# Patient Record
Sex: Female | Born: 1960 | Race: White | Hispanic: No | Marital: Married | Smoking: Former smoker
Health system: Southern US, Community
[De-identification: ages and names within clinical notes are randomized; demographics above are authoritative.]

## PROBLEM LIST (undated history)

## (undated) DIAGNOSIS — I358 Other nonrheumatic aortic valve disorders: Secondary | ICD-10-CM

## (undated) DIAGNOSIS — F5101 Primary insomnia: Secondary | ICD-10-CM

## (undated) DIAGNOSIS — R0683 Snoring: Secondary | ICD-10-CM

## (undated) DIAGNOSIS — E782 Mixed hyperlipidemia: Secondary | ICD-10-CM

## (undated) DIAGNOSIS — G2581 Restless legs syndrome: Secondary | ICD-10-CM

## (undated) DIAGNOSIS — E279 Disorder of adrenal gland, unspecified: Secondary | ICD-10-CM

## (undated) DIAGNOSIS — F419 Anxiety disorder, unspecified: Secondary | ICD-10-CM

## (undated) DIAGNOSIS — E278 Other specified disorders of adrenal gland: Secondary | ICD-10-CM

## (undated) DIAGNOSIS — F411 Generalized anxiety disorder: Secondary | ICD-10-CM

## (undated) DIAGNOSIS — I200 Unstable angina: Secondary | ICD-10-CM

## (undated) DIAGNOSIS — F330 Major depressive disorder, recurrent, mild: Secondary | ICD-10-CM

## (undated) HISTORY — DX: Anxiety disorder, unspecified: F41.9

## (undated) HISTORY — DX: Disorder of adrenal gland, unspecified: E27.9

## (undated) HISTORY — DX: Restless legs syndrome: G25.81

## (undated) HISTORY — DX: Unstable angina: I20.0

## (undated) HISTORY — DX: Primary insomnia: F51.01

## (undated) HISTORY — DX: Major depressive disorder, recurrent, mild: F33.0

## (undated) HISTORY — DX: Generalized anxiety disorder: F41.1

## (undated) HISTORY — DX: Other specified disorders of adrenal gland: E27.8

## (undated) HISTORY — DX: Mixed hyperlipidemia: E78.2

## (undated) HISTORY — PX: CHOLECYSTECTOMY: SHX55

## (undated) HISTORY — DX: Other nonrheumatic aortic valve disorders: I35.8

## (undated) HISTORY — DX: Snoring: R06.83

## (undated) HISTORY — PX: ABDOMINAL HYSTERECTOMY: SHX81

---

## 2002-07-31 ENCOUNTER — Other Ambulatory Visit: Admission: RE | Admit: 2002-07-31 | Discharge: 2002-07-31 | Payer: Self-pay | Admitting: Obstetrics and Gynecology

## 2015-10-14 DIAGNOSIS — R5383 Other fatigue: Secondary | ICD-10-CM | POA: Insufficient documentation

## 2015-10-14 DIAGNOSIS — N390 Urinary tract infection, site not specified: Secondary | ICD-10-CM

## 2015-10-14 DIAGNOSIS — R1032 Left lower quadrant pain: Secondary | ICD-10-CM | POA: Insufficient documentation

## 2015-10-14 DIAGNOSIS — I10 Essential (primary) hypertension: Secondary | ICD-10-CM | POA: Insufficient documentation

## 2015-10-14 HISTORY — DX: Left lower quadrant pain: R10.32

## 2015-10-14 HISTORY — DX: Urinary tract infection, site not specified: N39.0

## 2015-10-14 HISTORY — DX: Essential (primary) hypertension: I10

## 2015-10-14 HISTORY — DX: Other fatigue: R53.83

## 2015-11-29 DIAGNOSIS — R0609 Other forms of dyspnea: Secondary | ICD-10-CM | POA: Insufficient documentation

## 2015-11-29 DIAGNOSIS — R0789 Other chest pain: Secondary | ICD-10-CM

## 2015-11-29 DIAGNOSIS — J431 Panlobular emphysema: Secondary | ICD-10-CM | POA: Insufficient documentation

## 2015-11-29 DIAGNOSIS — Z8249 Family history of ischemic heart disease and other diseases of the circulatory system: Secondary | ICD-10-CM | POA: Insufficient documentation

## 2015-11-29 HISTORY — DX: Other forms of dyspnea: R06.09

## 2015-11-29 HISTORY — DX: Family history of ischemic heart disease and other diseases of the circulatory system: Z82.49

## 2015-11-29 HISTORY — DX: Panlobular emphysema: J43.1

## 2015-11-29 HISTORY — DX: Other chest pain: R07.89

## 2021-10-03 ENCOUNTER — Encounter: Payer: Self-pay | Admitting: Gastroenterology

## 2021-10-27 ENCOUNTER — Ambulatory Visit: Payer: Self-pay | Admitting: Gastroenterology

## 2021-11-01 ENCOUNTER — Encounter: Payer: Self-pay | Admitting: Nurse Practitioner

## 2021-12-02 DIAGNOSIS — R55 Syncope and collapse: Secondary | ICD-10-CM

## 2021-12-03 DIAGNOSIS — R079 Chest pain, unspecified: Secondary | ICD-10-CM

## 2022-01-25 ENCOUNTER — Encounter: Payer: Self-pay | Admitting: *Deleted

## 2022-01-25 ENCOUNTER — Encounter: Payer: Self-pay | Admitting: Cardiology

## 2022-02-07 ENCOUNTER — Inpatient Hospital Stay
Admission: AD | Admit: 2022-02-07 | Payer: BC Managed Care – PPO | Source: Other Acute Inpatient Hospital | Admitting: Internal Medicine

## 2022-02-07 DIAGNOSIS — R079 Chest pain, unspecified: Secondary | ICD-10-CM

## 2022-02-07 DIAGNOSIS — I10 Essential (primary) hypertension: Secondary | ICD-10-CM

## 2022-02-07 DIAGNOSIS — I200 Unstable angina: Secondary | ICD-10-CM

## 2022-02-07 DIAGNOSIS — E785 Hyperlipidemia, unspecified: Secondary | ICD-10-CM

## 2022-02-07 DIAGNOSIS — I2510 Atherosclerotic heart disease of native coronary artery without angina pectoris: Secondary | ICD-10-CM

## 2022-02-07 DIAGNOSIS — I358 Other nonrheumatic aortic valve disorders: Secondary | ICD-10-CM

## 2022-02-08 ENCOUNTER — Encounter (HOSPITAL_COMMUNITY)
Admission: AD | Disposition: A | Discharge status: Home / Self Care | Payer: Self-pay | Source: Other Acute Inpatient Hospital | Attending: Internal Medicine

## 2022-02-08 ENCOUNTER — Encounter (HOSPITAL_COMMUNITY): Payer: Self-pay | Admitting: Internal Medicine

## 2022-02-08 ENCOUNTER — Observation Stay (HOSPITAL_COMMUNITY)
Admission: AD | Admit: 2022-02-08 | Discharge: 2022-02-09 | Disposition: A | Discharge status: Home / Self Care | Payer: BC Managed Care – PPO | Source: Other Acute Inpatient Hospital | Attending: Internal Medicine | Admitting: Internal Medicine

## 2022-02-08 ENCOUNTER — Other Ambulatory Visit: Payer: Self-pay

## 2022-02-08 DIAGNOSIS — Z8249 Family history of ischemic heart disease and other diseases of the circulatory system: Secondary | ICD-10-CM | POA: Diagnosis not present

## 2022-02-08 DIAGNOSIS — Z87892 Personal history of anaphylaxis: Secondary | ICD-10-CM | POA: Diagnosis not present

## 2022-02-08 DIAGNOSIS — N2889 Other specified disorders of kidney and ureter: Secondary | ICD-10-CM | POA: Diagnosis not present

## 2022-02-08 DIAGNOSIS — I25110 Atherosclerotic heart disease of native coronary artery with unstable angina pectoris: Secondary | ICD-10-CM | POA: Diagnosis not present

## 2022-02-08 DIAGNOSIS — Z79899 Other long term (current) drug therapy: Secondary | ICD-10-CM | POA: Diagnosis not present

## 2022-02-08 DIAGNOSIS — I10 Essential (primary) hypertension: Secondary | ICD-10-CM | POA: Diagnosis not present

## 2022-02-08 DIAGNOSIS — J449 Chronic obstructive pulmonary disease, unspecified: Secondary | ICD-10-CM | POA: Insufficient documentation

## 2022-02-08 DIAGNOSIS — Z823 Family history of stroke: Secondary | ICD-10-CM

## 2022-02-08 DIAGNOSIS — F411 Generalized anxiety disorder: Secondary | ICD-10-CM | POA: Diagnosis present

## 2022-02-08 DIAGNOSIS — Z87440 Personal history of urinary (tract) infections: Secondary | ICD-10-CM | POA: Diagnosis not present

## 2022-02-08 DIAGNOSIS — I2510 Atherosclerotic heart disease of native coronary artery without angina pectoris: Principal | ICD-10-CM | POA: Diagnosis present

## 2022-02-08 DIAGNOSIS — J431 Panlobular emphysema: Secondary | ICD-10-CM | POA: Diagnosis present

## 2022-02-08 DIAGNOSIS — F330 Major depressive disorder, recurrent, mild: Secondary | ICD-10-CM | POA: Diagnosis present

## 2022-02-08 DIAGNOSIS — Z885 Allergy status to narcotic agent status: Secondary | ICD-10-CM

## 2022-02-08 DIAGNOSIS — Z833 Family history of diabetes mellitus: Secondary | ICD-10-CM | POA: Diagnosis not present

## 2022-02-08 DIAGNOSIS — Z83438 Family history of other disorder of lipoprotein metabolism and other lipidemia: Secondary | ICD-10-CM

## 2022-02-08 DIAGNOSIS — E278 Other specified disorders of adrenal gland: Secondary | ICD-10-CM | POA: Diagnosis present

## 2022-02-08 DIAGNOSIS — Z7982 Long term (current) use of aspirin: Secondary | ICD-10-CM | POA: Diagnosis not present

## 2022-02-08 DIAGNOSIS — Z87891 Personal history of nicotine dependence: Secondary | ICD-10-CM | POA: Insufficient documentation

## 2022-02-08 DIAGNOSIS — Z90710 Acquired absence of both cervix and uterus: Secondary | ICD-10-CM

## 2022-02-08 DIAGNOSIS — Z888 Allergy status to other drugs, medicaments and biological substances status: Secondary | ICD-10-CM | POA: Diagnosis not present

## 2022-02-08 DIAGNOSIS — F419 Anxiety disorder, unspecified: Secondary | ICD-10-CM | POA: Diagnosis present

## 2022-02-08 DIAGNOSIS — Z01810 Encounter for preprocedural cardiovascular examination: Secondary | ICD-10-CM | POA: Diagnosis not present

## 2022-02-08 DIAGNOSIS — G2581 Restless legs syndrome: Secondary | ICD-10-CM | POA: Diagnosis present

## 2022-02-08 DIAGNOSIS — I200 Unstable angina: Secondary | ICD-10-CM | POA: Diagnosis not present

## 2022-02-08 DIAGNOSIS — I700 Atherosclerosis of aorta: Secondary | ICD-10-CM | POA: Diagnosis present

## 2022-02-08 DIAGNOSIS — R079 Chest pain, unspecified: Secondary | ICD-10-CM | POA: Diagnosis present

## 2022-02-08 DIAGNOSIS — E782 Mixed hyperlipidemia: Secondary | ICD-10-CM | POA: Diagnosis present

## 2022-02-08 DIAGNOSIS — I358 Other nonrheumatic aortic valve disorders: Secondary | ICD-10-CM | POA: Diagnosis not present

## 2022-02-08 DIAGNOSIS — R7303 Prediabetes: Secondary | ICD-10-CM | POA: Diagnosis present

## 2022-02-08 HISTORY — DX: Atherosclerotic heart disease of native coronary artery without angina pectoris: I25.10

## 2022-02-08 HISTORY — PX: LEFT HEART CATH AND CORONARY ANGIOGRAPHY: CATH118249

## 2022-02-08 LAB — URINALYSIS, ROUTINE W REFLEX MICROSCOPIC
Bilirubin Urine: NEGATIVE
Glucose, UA: NEGATIVE mg/dL
Hgb urine dipstick: NEGATIVE
Ketones, ur: NEGATIVE mg/dL
Leukocytes,Ua: NEGATIVE
Nitrite: NEGATIVE
Protein, ur: NEGATIVE mg/dL
Specific Gravity, Urine: 1.018 (ref 1.005–1.030)
pH: 7 (ref 5.0–8.0)

## 2022-02-08 LAB — SURGICAL PCR SCREEN
MRSA, PCR: NEGATIVE
Staphylococcus aureus: NEGATIVE

## 2022-02-08 SURGERY — LEFT HEART CATH AND CORONARY ANGIOGRAPHY
Anesthesia: LOCAL

## 2022-02-08 MED ORDER — HEPARIN SODIUM (PORCINE) 1000 UNIT/ML IJ SOLN
INTRAMUSCULAR | Status: DC | PRN
  Administered 2022-02-08: 5000 [IU] via INTRAVENOUS

## 2022-02-08 MED ORDER — LABETALOL HCL 5 MG/ML IV SOLN: 10.0000 mg | INTRAVENOUS | Status: AC | PRN

## 2022-02-08 MED ORDER — FENTANYL CITRATE (PF) 100 MCG/2ML IJ SOLN
INTRAMUSCULAR | Status: DC | PRN
Start: 2022-02-08 — End: 2022-02-08
  Administered 2022-02-08: 25 ug via INTRAVENOUS

## 2022-02-08 MED ORDER — VERAPAMIL HCL 2.5 MG/ML IV SOLN
INTRAVENOUS | Status: DC | PRN
  Administered 2022-02-08: 10 mL via INTRA_ARTERIAL

## 2022-02-08 MED ORDER — MIDAZOLAM HCL 2 MG/2ML IJ SOLN
INTRAMUSCULAR | Status: AC
  Filled 2022-02-08: qty 2

## 2022-02-08 MED ORDER — HEPARIN (PORCINE) IN NACL 1000-0.9 UT/500ML-% IV SOLN
INTRAVENOUS | Status: AC
  Filled 2022-02-08: qty 1000

## 2022-02-08 MED ORDER — SODIUM CHLORIDE 0.9 % IV SOLN: INTRAVENOUS | Status: AC

## 2022-02-08 MED ORDER — HYDRALAZINE HCL 20 MG/ML IJ SOLN: 10.0000 mg | INTRAMUSCULAR | Status: AC | PRN

## 2022-02-08 MED ORDER — NITROGLYCERIN 0.4 MG/HR TD PT24
0.4000 mg | MEDICATED_PATCH | Freq: Every day | TRANSDERMAL | Status: DC
  Administered 2022-02-08 – 2022-02-09 (×2): 0.4 mg via TRANSDERMAL
  Filled 2022-02-08 (×2): qty 1

## 2022-02-08 MED ORDER — SODIUM CHLORIDE 0.9 % IV SOLN
250.0000 mL | INTRAVENOUS | Status: DC | PRN
Start: 2022-02-08 — End: 2022-02-09

## 2022-02-08 MED ORDER — ONDANSETRON HCL 4 MG/2ML IJ SOLN: 4.0000 mg | Freq: Four times a day (QID) | INTRAMUSCULAR | Status: DC | PRN

## 2022-02-08 MED ORDER — HEPARIN SODIUM (PORCINE) 1000 UNIT/ML IJ SOLN
INTRAMUSCULAR | Status: AC
  Filled 2022-02-08: qty 10

## 2022-02-08 MED ORDER — MIDAZOLAM HCL 2 MG/2ML IJ SOLN
INTRAMUSCULAR | Status: DC | PRN
  Administered 2022-02-08: 1 mg via INTRAVENOUS

## 2022-02-08 MED ORDER — IOHEXOL 350 MG/ML SOLN
INTRAVENOUS | Status: DC | PRN
  Administered 2022-02-08: 55 mL

## 2022-02-08 MED ORDER — LIDOCAINE HCL (PF) 1 % IJ SOLN
INTRAMUSCULAR | Status: DC | PRN
  Administered 2022-02-08: 2 mL via INTRADERMAL

## 2022-02-08 MED ORDER — SODIUM CHLORIDE 0.9% FLUSH
3.0000 mL | Freq: Two times a day (BID) | INTRAVENOUS | Status: DC
  Administered 2022-02-08 – 2022-02-09 (×2): 3 mL via INTRAVENOUS

## 2022-02-08 MED ORDER — ATORVASTATIN CALCIUM 80 MG PO TABS
80.0000 mg | ORAL_TABLET | Freq: Every day | ORAL | Status: DC
  Administered 2022-02-08 – 2022-02-09 (×2): 80 mg via ORAL
  Filled 2022-02-08 (×2): qty 1

## 2022-02-08 MED ORDER — ASPIRIN 81 MG PO CHEW
81.0000 mg | CHEWABLE_TABLET | Freq: Every day | ORAL | Status: DC
  Administered 2022-02-08 – 2022-02-09 (×2): 81 mg via ORAL
  Filled 2022-02-08 (×2): qty 1

## 2022-02-08 MED ORDER — TRAZODONE HCL 100 MG PO TABS
100.0000 mg | ORAL_TABLET | Freq: Every day | ORAL | Status: DC
  Administered 2022-02-08: 100 mg via ORAL
  Filled 2022-02-08: qty 1

## 2022-02-08 MED ORDER — ACETAMINOPHEN 325 MG PO TABS
650.0000 mg | ORAL_TABLET | ORAL | Status: DC | PRN
  Administered 2022-02-08 – 2022-02-09 (×2): 650 mg via ORAL
  Filled 2022-02-08 (×2): qty 2

## 2022-02-08 MED ORDER — HEPARIN (PORCINE) IN NACL 1000-0.9 UT/500ML-% IV SOLN
INTRAVENOUS | Status: DC | PRN
  Administered 2022-02-08 (×2): 500 mL

## 2022-02-08 MED ORDER — LIDOCAINE HCL (PF) 1 % IJ SOLN
INTRAMUSCULAR | Status: AC
  Filled 2022-02-08: qty 30

## 2022-02-08 MED ORDER — FENTANYL CITRATE (PF) 100 MCG/2ML IJ SOLN
INTRAMUSCULAR | Status: AC
  Filled 2022-02-08: qty 2

## 2022-02-08 MED ORDER — VERAPAMIL HCL 2.5 MG/ML IV SOLN
INTRAVENOUS | Status: AC
Start: 2022-02-08 — End: ?
  Filled 2022-02-08: qty 2

## 2022-02-08 MED ORDER — SODIUM CHLORIDE 0.9% FLUSH
3.0000 mL | INTRAVENOUS | Status: DC | PRN
Start: 2022-02-08 — End: 2022-02-09

## 2022-02-08 SURGICAL SUPPLY — 11 items
CATH DIAG 6FR JR4 (CATHETERS) ×1 IMPLANT
CATH INFINITI 5FR ANG PIGTAIL (CATHETERS) ×1 IMPLANT
CATH INFINITI 6F FL3.5 (CATHETERS) ×1 IMPLANT
DEVICE RAD COMP TR BAND LRG (VASCULAR PRODUCTS) ×1 IMPLANT
GLIDESHEATH SLEND SS 6F .021 (SHEATH) ×1 IMPLANT
GUIDEWIRE INQWIRE 1.5J.035X260 (WIRE) IMPLANT
INQWIRE 1.5J .035X260CM (WIRE) ×2
KIT HEART LEFT (KITS) ×2 IMPLANT
PACK CARDIAC CATHETERIZATION (CUSTOM PROCEDURE TRAY) ×2 IMPLANT
TRANSDUCER W/STOPCOCK (MISCELLANEOUS) ×2 IMPLANT
TUBING CIL FLEX 10 FLL-RA (TUBING) ×2 IMPLANT

## 2022-02-08 NOTE — Consult Note (Addendum)
? ?   ?[address] ?      [place] [address] ?            [phone]       ? ?[facility] [place] ?[place] Record [id number] ?Date of Birth: 02-17-1961 ? ?Referring: Early Osmond, MD ?Primary Care: Clancy Gourd, NP ?Primary Cardiologist: Shirlee More, MD ? ?Chief Complaint:   Chest pain ? ? ?History of Present Illness:    ?  ?[name] is a 61 year old female 40-pack-year former smoker having quit in 2011 with a past medical history significant for dyslipidemia, COPD, hypertension, borderline diabetes, and gastroesophageal reflux disease.  She has a positive family history for premature coronary artery disease in both her father and mother.  She began having some chest discomfort back in February of this year and underwent a stress test showing no evidence of reversible ischemia.  An echocardiogram done at the same time showed LV function of 60 to 65% and mild aortic stenosis.  Since those studies were done in February, she has continued to have intermittent exertional chest discomfort.  She has not been under the care of a cardiologist but was scheduled to see Dr. Bettina Gavia in [place] on 02/23/2022.  ? Two days ago, she had an episode of chest pressure radiating to her left shoulder and left arm while she was driving.  This was associated with diaphoresis and nausea.  She presented to [facility] and was admitted for evaluation.  EKG showed nonspecific ST upsloping in the inferior and lateral leads.  Troponin was reportedly negative.  After evaluation by a cardiologist, transfer to [facility] for further evaluation with left heart catheterization was recommended. ?Left heart cath was performed earlier today demonstrated significant left main and multivessel coronary artery disease.  There is a 50% stenosis in the distal left main coronary.  There is a 40% mid LAD stenosis.  The circumflex has a 70% stenosis in between the origins of the first  and second obtuse marginals.  The right coronary artery has a 75% mid stenosis. ?A left ventriculogram was not performed.  The LVEDP was 11 mmHg. ?CT surgery has been asked to evaluate [name] for consideration of coronary bypass grafting. ? ?[name] is left handed. She has a full plate denture on top and has several teeth in her lower jaw that are bothersome.  ? ? ?Current Activity/ Functional Status: ?Patient is independent with mobility/ambulation, transfers, ADL's, IADL's. ?  ?Zubrod Score: ?At the time of surgery this patient?s most appropriate activity status/level should be described as: ?[]     0    Normal activity, no symptoms ?[x]     1    Restricted in physical strenuous activity but ambulatory, able to do out light work ?[]     2    Ambulatory and capable of self care, unable to do work activities, up and about                 more than 50%  Of the time                            ?[]     3    Only limited self care, in bed greater than 50% of waking hours ?[]     4    Completely disabled, no self care, confined to bed or chair ?[]     5    Moribund ? ?  Past Medical History:  ?Diagnosis Date  ? Adrenal nodule ([place])   ? Aortic heart murmur on examination   ? Chest pressure 11/29/2015  ? Dyspnea on exertion 11/29/2015  ? Essential hypertension 10/14/2015  ? Family history of coronary artery disease 11/29/2015  ? Fatigue 10/14/2015  ? Frequent UTI 10/14/2015  ? Generalized anxiety disorder   ? Insomnia, idiopathic   ? Left lower quadrant pain 10/14/2015  ? Mixed hyperlipidemia   ? Panlobular emphysema ([place]) 11/29/2015  ? 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 Restless leg syndrome   ? Snoring   ? ? ?Past Surgical History:  ?Procedure Laterality Date  ? ABDOMINAL HYSTERECTOMY    ? CHOLECYSTECTOMY    ? ? ?Social History  ? ?Tobacco Use  ?Smoking Status Not on file  ?Smokeless Tobacco Not on file  ?  ?Social History  ? ?Substance and Sexual Activity  ?Alcohol Use Never  ? ? ? ?Allergies   ?Allergen Reactions  ? Codeine Anaphylaxis  ?  Locked up ?  ? Bismuth Subsalicylate Nausea Only  ? ? ?Current Facility-Administered Medications  ?Medication Dose Route Frequency Provider Last Rate Last Admin  ? 0.9 %  sodium chloride infusion   Intravenous Continuous Early Osmond, MD      ? 0.9 %  sodium chloride infusion  250 mL Intravenous PRN Early Osmond, MD      ? acetaminophen (TYLENOL) tablet 650 mg  650 mg Oral Q4H PRN Early Osmond, MD      ? aspirin chewable tablet 81 mg  81 mg Oral Daily Early Osmond, MD      ? atorvastatin (LIPITOR) tablet 80 mg  80 mg Oral Daily Early Osmond, MD      ? hydrALAZINE (APRESOLINE) injection 10 mg  10 mg Intravenous Q20 Min PRN Early Osmond, MD      ? labetalol (NORMODYNE) injection 10 mg  10 mg Intravenous Q10 min PRN Early Osmond, MD      ? ondansetron [facility]) injection 4 mg  4 mg Intravenous Q6H PRN Early Osmond, MD      ? sodium chloride flush (NS) 0.9 % injection 3 mL  3 mL Intravenous Q12H Early Osmond, MD      ? sodium chloride flush (NS) 0.9 % injection 3 mL  3 mL Intravenous PRN Early Osmond, MD      ? ? ?Medications Prior to Admission  ?Medication Sig Dispense Refill Last Dose  ? aspirin EC 81 MG tablet Take 81 mg by mouth daily. Swallow whole.   02/07/2022  ? atorvastatin (LIPITOR) 40 MG tablet Take 40 mg by mouth daily.   02/07/2022  ? lisinopril-hydrochlorothiazide (ZESTORETIC) 20-25 MG tablet Take 1 tablet by mouth daily.   02/07/2022  ? Multiple Vitamin (MULTIVITAMIN WITH MINERALS) TABS tablet Take 1 tablet by mouth daily.   02/07/2022  ? Omega-3 Fatty Acids (FISH OIL) 1000 MG CAPS Take 1 capsule by mouth daily.   02/07/2022  ? traZODone (DESYREL) 100 MG tablet Take 100 mg by mouth at bedtime.   02/07/2022  ? ? ?Family History  ?Problem Relation Age of Onset  ? Diabetes type II Mother   ? Hypertension Mother   ? Hyperlipidemia Mother   ? Heart attack Mother   ? Liver cancer Father   ? Hypertension Father   ? Heart attack  Father   ? Diabetes Brother   ? Hypertension Brother   ? Stroke  Brother   ? Diabetes Brother   ? ? ? ?Review of Systems:  ? ? ?  Cardiac Review of Systems: Y or  [    ]= no ? Chest Pain [ x   ]  Resting SOB [   ] Exertional SOB  [  ]  Orthopnea [  ] ?  Pedal Edema [   ]    Palpitations [  ] Syncope  [  ]   Presyncope [   ] ? General Review of Systems: [Y] = yes [  ]=no ?Constitional: recent weight change [  ]; anorexia [  ]; fatigue [  ]; nausea [  ]; night sweats [  ]; fever [  ]; or chills [  ]                                                               ? Eye : blurred vision [  ]; diplopia [   ]; vision changes [  ];  Amaurosis fugax[  ]; ?Resp: cough [  ];  wheezing[  ];  hemoptysis[  ]; shortness of breath[  ]; paroxysmal nocturnal dyspnea[  ]; dyspnea on exertion[  ]; or orthopnea[  ];  ?GI:  gallstones[  ], vomiting[  ];  dysphagia[  ]; melena[  ];  hematochezia [  ]; heartburn[  ];   Hx of  Colonoscopy[  ]; ?GU: kidney stones [  ]; hematuria[  ];   dysuria [  ];  nocturia[  ];  history of     obstruction [  ]; urinary frequency [  ] ?            Skin: rash, swelling[  ];, hair loss[  ];  peripheral edema[  ];  or itching[  ]; ?Musculosketetal: myalgias[  ];  joint swelling[  ];  joint erythema[  ]; ? joint pain[  ];  back pain[  ]; ? Heme/Lymph: bruising[  ];  bleeding[  ];  anemia[  ];  ?Neuro: TIA[  ];  headaches[  ];  stroke[  ];  vertigo[  ];  seizures[  ];   paresthesias[  ];  difficulty walking[  ]; ? Psych:depression[  ]; anxiety[  ]; ? Endocrine: diabetes[  ];  thyroid dysfunction[  ]; ?       ?  ?  ? ?Physical Exam: ?BP 110/89   Pulse (!) 54   Temp 99.1 ?F (37.3 ?C) (Oral)   Resp 16   Ht 5\' 4"  (1.626 m)   SpO2 95%   BMI 31.76 kg/m?  ? ? ?General appearance: alert, cooperative, and no distress ?Head: Normocephalic, without obvious abnormality, atraumatic ?Neck: no adenopathy, no carotid bruit, no JVD, and supple, symmetrical, trachea midline ?Lymph nodes: No cervical or clavicular  adenopathy ?Resp: Breath sounds are full, equal, and clear to auscultation. ?Cardio: Regular rate and rhythm, monitor shows normal sinus rhythm.  There is a very soft grade 1/6 to 2/6 systolic murmur best heard along the lef

## 2022-02-08 NOTE — Progress Notes (Signed)
Complains of chest pain mid/left chest 2-3/10. Was a 7 before, much better. Left shoulder pain, achy 3/10. ?

## 2022-02-08 NOTE — Progress Notes (Signed)
TCTS consulted for CABG evaluation. °

## 2022-02-08 NOTE — H&P (Addendum)
Cardiology Admission History and Physical:   Patient ID: [name] MRN: [id number]; DOB: 1961/09/29   Admission date: 02/08/2022  PCP:  Tamsen Snider, NP   [facility] Providers Cardiologist: Has new provider appointment with Dr. Dulce Sellar 4/27  Chief Complaint: Chest pain  Patient Profile:   [name] is a 61 y.o. female with history of hypertension, hyperlipidemia, COPD, former smoker, premature family history of CAD, borderline diabetes and GERD who is being seen 02/08/2022 for the evaluation of chest pain concerning for unstable angina.  Patient was admitted to [facility] in February 2023 for chest pain evaluation. Stress test showing no reversible ischemia or infarction.  Echocardiogram showed LV function of 60 to 65% and mild aortic stenosis.  Outpatient CT scan of abdomen showing atherosclerosis and calcification of the aorta and coronary arteries. This was done for evaluation of kidney nodule.   Father had MI in his 30s. Mother with MI in 45s.  Brother with multiple MI. First in his 82s.  Hx of > 40 pack year tobacco smoking. Quite 2011.   Has pending follow-up with Dr. Dulce Sellar.  History of Present Illness:   [name] continues to have intermittent chest pain with exertional since discharge. On 4/10 patient had substernal chest pressure radiating to left shoulder and arm while driving.  Associated with diaphoresis and nausea.  Radiating to L shoulder. She presented to [facility] and being admitted for work-up.  Troponin negative x2.  EKG showing nonspecific ST upsloping in inferior lateral leads.  Placed on nitro patch. Mild intermittent pain. Patient was seen by locum cardiology and recommended cardiac catheterization for definite evaluation.  She is transferred to [facility].   In Cath Lab, patient denies any chest pain on nitro patch. Breathing stable.   COVID and respiratory panel negative Hemoglobin  13.5 Sodium 139 Potassium 4.0 Creatinine 0.5 Normal LFTs proBNP 37  HDL 35 LDL 95 Total cholesterol 160 Triglyceride 151    Past Medical History:  Diagnosis Date   Adrenal nodule ([facility])    Aortic heart murmur on examination    Chest pressure 11/29/2015   Dyspnea on exertion 11/29/2015   Essential hypertension 10/14/2015   Family history of coronary artery disease 11/29/2015   Fatigue 10/14/2015   Frequent UTI 10/14/2015   Generalized anxiety disorder    Insomnia, idiopathic    Left lower quadrant pain 10/14/2015   Mixed hyperlipidemia    Panlobular emphysema ([facility]) 11/29/2015   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 Authorizing Provider  lisinopril-hydrochlorothiazide (ZESTORETIC) 20-25 MG tablet Take 1 tablet by mouth daily. 02/02/20   [provider]  pravastatin (PRAVACHOL) 20 MG tablet Take 20 mg by mouth daily. 09/29/21   [provider]     Allergies:    Allergies  Allergen Reactions   Codeine Anaphylaxis    Locked up    Bismuth Subsalicylate Nausea Only    Social History:   Social History   Socioeconomic History   Marital status: Married    Spouse name: Not on file   Number of children: Not on file   Years of education: Not on file   Highest education level:  Not on file  Occupational History   Not on file  Tobacco Use   Smoking status: Not on file   Smokeless tobacco: Not on file  Substance and Sexual Activity   Alcohol use: Never   Drug use: Not on file   Sexual activity: Not on file  Other Topics Concern   Not on file  Social History Narrative   Not on file   Social Determinants of Health   Financial Resource Strain: Not on file  Food Insecurity:  Not on file  Transportation Needs: Not on file  Physical Activity: Not on file  Stress: Not on file  Social Connections: Not on file  Intimate Partner Violence: Not on file    Family History:   The patient's family history includes Diabetes in her brother and brother; Diabetes type II in her mother; Heart attack in her father and mother; Hyperlipidemia in her mother; Hypertension in her brother, father, and mother; Liver cancer in her father; Stroke in her brother.    ROS:  Please see the history of present illness.  All other ROS reviewed and negative.     Physical Exam/Data:  There were no vitals filed for this visit. No intake or output data in the 24 hours ending 02/08/22 1315    01/02/2022    1:08 PM  Last 3 Weights  Weight (lbs) 185 lb  Weight (kg) 83.915 kg     There is no height or weight on file to calculate BMI.  General:  Well nourished, well developed, in no acute distress HEENT: normal Neck: no JVD Vascular: No carotid bruits; Distal pulses 2+ bilaterally   Cardiac:  normal S1, S2; RRR; no murmur  Lungs:  clear to auscultation bilaterally, no wheezing, rhonchi or rales  Abd: soft, nontender, no hepatomegaly  Ext: no edema Musculoskeletal:  No deformities, BUE and BLE strength normal and equal Skin: warm and dry  Neuro:  CNs 2-12 intact, no focal abnormalities noted Psych:  Normal affect    EKG:  The ECG that was done at [facility] was personally reviewed and demonstrates normal sinus rhythm, nonspecific ST changes inferior lateral leads  Relevant CV Studies: As summarized above  Laboratory Data:  High Sensitivity Troponin:  No results for input(s): TROPONINIHS in the last 720 hours.    ChemistryNo results for input(s): NA, K, CL, CO2, GLUCOSE, BUN, CREATININE, CALCIUM, MG, GFRNONAA, GFRAA, ANIONGAP in the last 168 hours.  No results for input(s): PROT, ALBUMIN, AST, ALT, ALKPHOS, BILITOT in the last 168 hours. Lipids No results for input(s): CHOL,  TRIG, HDL, LABVLDL, LDLCALC, CHOLHDL in the last 168 hours. HematologyNo results for input(s): WBC, RBC, HGB, HCT, MCV, MCH, MCHC, RDW, PLT in the last 168 hours. Thyroid No results for input(s): TSH, FREET4 in the last 168 hours. BNPNo results for input(s): BNP, PROBNP in the last 168 hours.  DDimer No results for input(s): DDIMER in the last 168 hours.  Reviewed outside hospital records  Radiology/Studies:  No results found.   Assessment and Plan:   Unstable angina -Patient with 2 months history of intermittent chest pain.  Negative stress test and reassuring echocardiogram in February.  Worse episode 4/10 leading to another admission.  Troponin negative.  EKG with nonspecific changes.  Her symptoms is concerning for unstable angina.  Significant cardiac risk factors including hypertension, hyperlipidemia, family history (multiple family member with MIs in their 15s), prior smoker (> 40 pack yr tobacco smoking) and evidence of coronary calcification on CT of abdomen. -  Pending cardiac catheterization  2.  Hypertensive -We will continue home lisinopril/hydrochlorothiazide (pending pharmacy review)  3.  Hyperlipidemia HDL 35 LDL 95 Total cholesterol 160 Triglyceride 151 -Continue home statin (update based on cath report)  4. Kidney nodules   Risk Assessment/Risk Scores:   HEAR Score (for undifferentiated chest pain):  HEAR Score: 6{  Severity of Illness: The appropriate patient status for this patient is OBSERVATION. Observation status is judged to be reasonable and necessary in order to provide the required intensity of service to ensure the patient's safety. The patient's presenting symptoms, physical exam findings, and initial radiographic and laboratory data in the context of their medical condition is felt to place them at decreased risk for further clinical deterioration. Furthermore, it is anticipated that the patient will be medically stable for discharge from the hospital  within 2 midnights of admission.    For questions or updates, please contact [facility] Please consult [email or web address] for contact info under     Signed, Manson Passey, PA  02/08/2022 1:15 PM    ATTENDING ATTESTATION:  After conducting a review of all available clinical information with the care team, interviewing the patient, and performing a physical exam, I agree with the findings and plan described in this note.   GEN: No acute distress.   HEENT:  MMM, no JVD, no scleral icterus Cardiac: RRR, no murmurs, rubs, or gallops.  Respiratory: Clear to auscultation bilaterally. GI: Soft, nontender, non-distended  MS: No edema; No deformity. Neuro:  Nonfocal  Vasc:  +2 radial pulses  The patient is a 61 year old female with a family history of premature coronary artery disease, borderline diabetes, hypertension, hyperlipidemia, and former smoking who was evaluated at another hospital due to unstable angina.  She had a stress test recently which demonstrated no abnormalities.  Evaluation at originating hospital demonstrated negative troponins and EKG demonstrated nonspecific changes.  She did have an outpatient CT scan of her abdomen which showed atherosclerosis of the aorta and calcification of the coronary arteries.  Due to chest pain despite negative stress test she was transferred to [facility] and referred for coronary angiography.  Further recommendations will follow the results of her coronary angiography study.    Alverda Skeans, MD Pager [phone]

## 2022-02-09 ENCOUNTER — Other Ambulatory Visit: Payer: Self-pay | Admitting: Surgical

## 2022-02-09 ENCOUNTER — Other Ambulatory Visit (HOSPITAL_COMMUNITY): Payer: Self-pay | Admitting: *Deleted

## 2022-02-09 ENCOUNTER — Other Ambulatory Visit (HOSPITAL_COMMUNITY): Payer: BC Managed Care – PPO

## 2022-02-09 ENCOUNTER — Encounter (HOSPITAL_COMMUNITY): Payer: Self-pay | Admitting: Internal Medicine

## 2022-02-09 ENCOUNTER — Inpatient Hospital Stay (HOSPITAL_COMMUNITY): Payer: BC Managed Care – PPO

## 2022-02-09 ENCOUNTER — Encounter (HOSPITAL_COMMUNITY): Payer: BC Managed Care – PPO

## 2022-02-09 ENCOUNTER — Other Ambulatory Visit: Payer: Self-pay

## 2022-02-09 DIAGNOSIS — I25110 Atherosclerotic heart disease of native coronary artery with unstable angina pectoris: Secondary | ICD-10-CM | POA: Diagnosis not present

## 2022-02-09 DIAGNOSIS — I25119 Atherosclerotic heart disease of native coronary artery with unspecified angina pectoris: Secondary | ICD-10-CM

## 2022-02-09 DIAGNOSIS — J449 Chronic obstructive pulmonary disease, unspecified: Secondary | ICD-10-CM | POA: Diagnosis not present

## 2022-02-09 DIAGNOSIS — I10 Essential (primary) hypertension: Secondary | ICD-10-CM | POA: Diagnosis not present

## 2022-02-09 DIAGNOSIS — Z01810 Encounter for preprocedural cardiovascular examination: Secondary | ICD-10-CM | POA: Diagnosis not present

## 2022-02-09 DIAGNOSIS — I2510 Atherosclerotic heart disease of native coronary artery without angina pectoris: Secondary | ICD-10-CM

## 2022-02-09 DIAGNOSIS — Z87891 Personal history of nicotine dependence: Secondary | ICD-10-CM | POA: Diagnosis not present

## 2022-02-09 LAB — HEPATIC FUNCTION PANEL
ALT: 34 U/L (ref 0–44)
AST: 28 U/L (ref 15–41)
Albumin: 3.9 g/dL (ref 3.5–5.0)
Alkaline Phosphatase: 53 U/L (ref 38–126)
Bilirubin, Direct: <0.1 mg/dL (ref 0.0–0.2)
Total Bilirubin: 0.2 mg/dL — ABNORMAL LOW (ref 0.3–1.2)
Total Protein: 6.8 g/dL (ref 6.5–8.1)

## 2022-02-09 LAB — BLOOD GAS, ARTERIAL
Acid-Base Excess: 2 mmol/L (ref 0.0–2.0)
Bicarbonate: 27.3 mmol/L (ref 20.0–28.0)
Drawn by: 44166
O2 Saturation: 96 %
Patient temperature: 37.2
pCO2 arterial: 44 mmHg (ref 32–48)
pH, Arterial: 7.4 (ref 7.35–7.45)
pO2, Arterial: 81 mmHg — ABNORMAL LOW (ref 83–108)

## 2022-02-09 LAB — CBC
HCT: 42.2 % (ref 36.0–46.0)
Hemoglobin: 14.9 g/dL (ref 12.0–15.0)
MCH: 32.5 pg (ref 26.0–34.0)
MCHC: 35.3 g/dL (ref 30.0–36.0)
MCV: 91.9 fL (ref 80.0–100.0)
Platelets: 262 10*3/uL (ref 150–400)
RBC: 4.59 MIL/uL (ref 3.87–5.11)
RDW: 12.1 % (ref 11.5–15.5)
WBC: 6.9 10*3/uL (ref 4.0–10.5)
nRBC: 0 % (ref 0.0–0.2)

## 2022-02-09 LAB — HEMOGLOBIN A1C
Hgb A1c MFr Bld: 5.5 % (ref 4.8–5.6)
Mean Plasma Glucose: 111.15 mg/dL

## 2022-02-09 LAB — BASIC METABOLIC PANEL
Anion gap: 7 (ref 5–15)
BUN: 16 mg/dL (ref 6–20)
CO2: 26 mmol/L (ref 22–32)
Calcium: 8.9 mg/dL (ref 8.9–10.3)
Chloride: 106 mmol/L (ref 98–111)
Creatinine, Ser: 0.66 mg/dL (ref 0.44–1.00)
GFR, Estimated: >60 mL/min (ref >60)
Glucose, Bld: 119 mg/dL — ABNORMAL HIGH (ref 70–99)
Potassium: 3.6 mmol/L (ref 3.5–5.1)
Sodium: 139 mmol/L (ref 135–145)

## 2022-02-09 LAB — PROTIME-INR
INR: 1 (ref 0.8–1.2)
Prothrombin Time: 12.7 seconds (ref 11.4–15.2)

## 2022-02-09 LAB — ECHOCARDIOGRAM COMPLETE
Area-P 1/2: 2.57 cm2
Height: 64 in
S' Lateral: 3.2 cm

## 2022-02-09 LAB — APTT: aPTT: 25 seconds (ref 24–36)

## 2022-02-09 LAB — TSH: TSH: 2.619 u[IU]/mL (ref 0.350–4.500)

## 2022-02-09 MED ORDER — CHLORHEXIDINE GLUCONATE 4 % EX LIQD: Freq: Once | CUTANEOUS | 0 refills | Status: AC

## 2022-02-09 MED ORDER — ISOSORBIDE MONONITRATE ER 30 MG PO TB24: 30.0000 mg | ORAL_TABLET | Freq: Every day | ORAL | 1 refills | Status: DC

## 2022-02-09 MED ORDER — ATORVASTATIN CALCIUM 80 MG PO TABS: 80.0000 mg | ORAL_TABLET | Freq: Every day | ORAL | 3 refills | Status: DC

## 2022-02-09 NOTE — TOC Transition Note (Signed)
Transition of Care (TOC) - CM/SW Discharge Note ? ? ?Patient Details  ?Name: [name] ?MRN: [id number] ?Date of Birth: 10-17-1961 ? ?Transition of Care (TOC) CM/SW Contact:  ?Kermit Balo, RN ?Phone Number: ?02/09/2022, 12:17 PM ? ? ?Clinical Narrative:    ?Patient is discharging home with self care. Plan is for her to return on April 18th for CABG.  ?Pt has transportation home.  ? ? ?Final next level of care: Home/Self Care ?Barriers to Discharge: No Barriers Identified ? ? ?Patient Goals and CMS Choice ?  ?  ?  ? ?Discharge Placement ?  ?           ?  ?  ?  ?  ? ?Discharge Plan and Services ?  ?  ?           ?  ?  ?  ?  ?  ?  ?  ?  ?  ?  ? ?Social Determinants of Health (SDOH) Interventions ?  ? ? ?Readmission Risk Interventions ?   ? View : No data to display.  ?  ?  ?  ? ? ? ? ? ?

## 2022-02-09 NOTE — Discharge Summary (Addendum)
?Discharge Summary  ?  ?Patient ID: [name] ?MRN: [id number]; DOB: 02/04/1961 ? ?Admit date: 02/08/2022 ?Discharge date: 02/09/2022 ? ?PCP:  Tamsen SniderFerrell, Kirstie B, NP ?  ?[facility] Providers ?Cardiologist:  New to Dr Dulce SellarMunley ? ? ?Discharge Diagnoses  ?  ?Principal Problem: ?  CAD (coronary artery disease) ? ? ? ?Diagnostic Studies/Procedures  ?  ?Left heart catheterization from 02/08/2022: ? ?  Mid RCA lesion is 75% stenosed. ?  Mid LAD lesion is 40% stenosed. ?  Mid Cx lesion is 70% stenosed. ?  Dist LM lesion is 50% stenosed. ?  LV end diastolic pressure is normal. ?  ?1.  Multivessel disease consisting of distal left main, left circumflex and right coronary artery.  The arterial tree is diffusely calcified. ?2.  LVEDP of 11 mmHg. ?  ?Recommendation: Cardiothoracic surgical consult. ?Diagnostic ?Dominance: Right ? ? ?  ?History of Present Illness   ?  ? ?Per H&P 02/08/2022: ? ?[facility] is a 61 y.o. female with history of hypertension, hyperlipidemia, COPD, former smoker, premature family history of CAD, borderline diabetes and GERD who was seen on 02/08/2022 for the evaluation of chest pain concerning for unstable angina. ?  ?Patient was [facility] to [facility] February 2023 for chest pain evaluation. Stress test showing no reversible ischemia or infarction.  Echocardiogram showed LV function of 60 to 65% and mild aortic stenosis. ?  ?Outpatient CT scan of abdomen showing atherosclerosis and calcification of the aorta and coronary arteries. This was done for evaluation of kidney nodule.  ?  ?Father had MI in his 7250s. ?Mother with MI in 2450s.  ?Brother with multiple MI. First in his 4550s.  ?Hx of > 40 pack year tobacco smoking. Quite 2011.  ?  ?Has pending follow-up with Dr. Dulce SellarMunley. ? ?[name] continued to have intermittent chest pain with exertional since discharge. On 4/10 patient had substernal chest pressure radiating to left shoulder and arm while driving.   Associated with diaphoresis and nausea.  Radiating to L shoulder. She [facility] and being admitted for work-up.  Troponin negative x2.  EKG showing nonspecific ST upsloping in inferior lateral leads.  Placed on nitro patch. Mild intermittent pain. Patient was seen by locum cardiology and recommended cardiac catheterization for definite evaluation.  She [facility].  ?  ?In Cath Lab, patient denies any chest pain on nitro patch. Breathing stable.  ?  ?COVID and respiratory panel negative ?Hemoglobin 13.5 ?Sodium 139 ?Potassium 4.0 ?Creatinine 0.5 ?Normal LFTs ?proBNP 37 ?  ?HDL 35 ?LDL 95 ?Total cholesterol 160 ?Triglyceride 151 ?  ?  ? ?Hospital Course  ?   ?Consultants: N/A  ? ?Multivessel CAD ?-[facility] evaluation of chest pain ?-Admission troponin and EKG without acute ischemic finding ?-CT abdomen revealed aortic atherosclerosis and calcification of coronary arteries ?-Left heart catheterization 02/08/22 revealed multivessel CAD involving 50% left main coronary stenosis and three-vessel coronary disease and normal LVEDP, CT surgery consult was recommended ?-Complete echocardiogram is pending today/may follow up result in office, last Echo from 2/23 EF 60-65% ?-Hemoglobin A1c 5.5%, TSH WNL; LDL 95 from recent outpatient lab ?-Patient was evaluated by Dr.Vantrigt CTS, CABG is recommended however OR cannot be booked until next week, patient was felt safe for discharge and will be arranged for surgery outpatient ?-Continue aspirin 81mg , increased lipitor 80mg , no beta-blocker due to baseline sinus bradycardia (rate down to 38), will start Imdur 30mg  daily  for anti-angina therapy  ?- Follow up with cardiology Dr Dulce Sellar on 02/23/22 has been arranged  ? ?Hypertension ?-Blood pressure low normal, home medication lisinopril-hydrochlorothiazide was held before cath, will discontinue and start Imdur today, monitor BP at home and bring logs  to office follow up  ? ?Hyperlipidemia ?-LDL 95 from recent outpatient lab, will increase Lipitor to 80 mg daily ? ?Hx of Prediabetes ?-A1c 5.5%, discussed lifestyle modification ? ?COPD ?-No evidence of acute exacerbation at this time, on PRN albuterol , quit smoking since 2017 ? ? ?Did the patient have an acute coronary syndrome (MI, NSTEMI, STEMI, etc) this admission?:  No                               ?Did the patient have a percutaneous coronary intervention (stent / angioplasty)?:  No.   ? ?   ? ?  ?_____________ ? ?Discharge Vitals ?Blood pressure 108/74, pulse 64, temperature 97.9 ?F (36.6 ?C), temperature source Oral, resp. rate 18, height 5\' 4"  (1.626 m), SpO2 96 %.  ?There were no vitals filed for this visit. ? ?Vitals:  ?Vitals:  ? 02/08/22 2031 02/09/22 0500  ?BP: 135/89 108/74  ?Pulse: 61 64  ?Resp: 18 18  ?Temp: 99 ?F (37.2 ?C) 97.9 ?F (36.6 ?C)  ?SpO2: 93% 96%  ? ?General Appearance: In no apparent distress, laying in bed ?HEENT: Normocephalic, atraumatic.  ?Neck: Supple, trachea midline, no JVDs ?Cardiovascular: Regular rate and rhythm, normal S1-S2,  no murmur/rub/gallop ?Respiratory: Resting breathing unlabored, lungs sounds clear to auscultation bilaterally, no use of accessory muscles. On room air.  No wheezes, rales or rhonchi.   ?Gastrointestinal: Bowel sounds positive, abdomen soft ?Extremities: Able to move all extremities in bed without difficulty, no edema/cyanosis/clubbing, right radial pulse 2+, right hand warm, no neurovascular deficit, post cath site clean with dressing, no hematoma or bleeding  ?Musculoskeletal: Normal muscle bulk and tone ?Skin: Intact, warm, dry.  ?Neurologic: Alert, oriented to person, place and time. Fluent speech, no facial droop, no cognitive deficit ?Psychiatric: Normal affect. Mood is appropriate.  ? ? ?Labs & Radiologic Studies  ?  ?CBC ?Recent Labs  ?  02/09/22 ?0712  ?WBC 6.9  ?HGB 14.9  ?HCT 42.2  ?MCV 91.9  ?PLT 262  ? ?Basic Metabolic Panel ?Recent Labs   ?  02/09/22 ?0712  ?NA 139  ?K 3.6  ?CL 106  ?CO2 26  ?GLUCOSE 119*  ?BUN 16  ?CREATININE 0.66  ?CALCIUM 8.9  ? ?Liver Function Tests ?Recent Labs  ?  02/09/22 ?0712  ?AST 28  ?ALT 34  ?ALKPHOS 53  ?BILITOT 0.2*  ?PROT 6.8  ?ALBUMIN 3.9  ? ?No results for input(s): LIPASE, AMYLASE in the last 72 hours. ?High Sensitivity Troponin:   ?No results for input(s): TROPONINIHS in the last 720 hours.  ?BNP ?Invalid input(s): POCBNP ?D-Dimer ?No results for input(s): DDIMER in the last 72 hours. ?Hemoglobin A1C ?Recent Labs  ?  02/09/22 ?0713  ?HGBA1C 5.5  ? ?Fasting Lipid Panel ?No results for input(s): CHOL, HDL, LDLCALC, TRIG, CHOLHDL, LDLDIRECT in the last 72 hours. ?Thyroid Function Tests ?Recent Labs  ?  02/09/22 ?0712  ?TSH 2.619  ? ?_____________  ?DG Chest 2 View ? ?Result Date: 02/09/2022 ?CLINICAL DATA:  History not provided. EXAM: CHEST - 2 VIEW COMPARISON:  02/06/2022. FINDINGS: The heart size and mediastinal contours are within normal limits. Both lungs are clear. No acute osseous abnormality. IMPRESSION: No active  cardiopulmonary disease. Electronically Signed   By: Thornell Sartorius M.D.   On: 02/09/2022 04:36  ? ?CARDIAC CATHETERIZATION ? ?Result Date: 02/08/2022 ?  Mid RCA lesion is 75% stenosed.   Mid LAD lesion is 40% stenosed.   Mid Cx lesion is 70% stenosed.   Dist LM lesion is 50% stenosed.   LV end diastolic pressure is normal. 1.  Multivessel disease consisting of distal left main, left circumflex and right coronary artery.  The arterial tree is diffusely calcified. 2.  LVEDP of 11 mmHg. Recommendation: Cardiothoracic surgical consult.   ?Disposition  ? ?Patient states she feels well, denied any chest pain, reports resolved headache from tylenol. She is eager to go home for a break. She is agreeable with planned CABG. Post cath radial site care and instruction discussed in detail at bedside. She is agreeable with medication change as well as follow up.  Pt is being discharged home today in good condition.  See attending addendum.   ? ?Follow-up Plans & Appointments  ? ? ? Follow-up Information   ? ? Baldo Daub, MD Follow up on 02/23/2022.   ?Specialties: Cardiology, Radiology ?Why: at 9:40am for cardiology fo

## 2022-02-09 NOTE — Progress Notes (Signed)
Hibiclens scrub  RX sent electronically to patients pharmacy to be used preop ? ?Rowe Clack, PA-C  ?

## 2022-02-09 NOTE — Progress Notes (Signed)
?  Transition of Care (TOC) Screening Note ? ? ?Patient Details  ?Name: [name] ?Date of Birth: 1961/09/30 ? ? ?Transition of Care (TOC) CM/SW Contact:    ?Delilah Shan, LCSWA ?Phone Number: ?02/09/2022, 11:00 AM ? ? ? ?Transition of Care Department [facility]) has reviewed patient and no TOC needs have been identified at this time. We will continue to monitor patient advancement through interdisciplinary progression rounds. If new patient transition needs arise, please place a TOC consult. ?  ?

## 2022-02-09 NOTE — Progress Notes (Signed)
Went over discharge paper work with patient and family member. PIVs removed. All belongings at beside.  ?

## 2022-02-09 NOTE — Progress Notes (Signed)
?  Echocardiogram ?2D Echocardiogram has been performed. ? ?[name] ?02/09/2022, 1:08 PM ?

## 2022-02-09 NOTE — Progress Notes (Signed)
CARDIAC REHAB PHASE I  ? ?PRE:  Rate/Rhythm: 60 SR ? ?  BP: sitting 134/87 ? ?  SaO2:  ? ?MODE:  Ambulation: 400 ft  ? ?POST:  Rate/Rhythm: 76 SR ? ?  BP: sitting 149/91  ? ?  SaO2: 96 RA ? ?Tolerated well, c/o mild SOB, denied CP or nausea like shes been having daily. Discussed with pt IS (2500 ml), sternal precautions, mobility post op, and d/c planning. Receptive, sts her husband will be with her after surgical d/c. Reminders given of radial precautions as well.  ?[phone]  ? ?Ethelda Chick CES, ACSM ?02/09/2022 ?9:29 AM ? ? ? ? ?

## 2022-02-10 ENCOUNTER — Encounter (HOSPITAL_COMMUNITY): Payer: Self-pay

## 2022-02-10 ENCOUNTER — Other Ambulatory Visit (HOSPITAL_COMMUNITY): Payer: Self-pay | Admitting: Cardiothoracic Surgery

## 2022-02-10 ENCOUNTER — Other Ambulatory Visit (HOSPITAL_COMMUNITY): Payer: Self-pay | Admitting: *Deleted

## 2022-02-10 ENCOUNTER — Encounter (HOSPITAL_COMMUNITY): Payer: Self-pay | Admitting: *Deleted

## 2022-02-10 ENCOUNTER — Ambulatory Visit (HOSPITAL_COMMUNITY): Admission: RE | Admit: 2022-02-10 | Payer: BC Managed Care – PPO | Source: Ambulatory Visit

## 2022-02-10 ENCOUNTER — Encounter (HOSPITAL_COMMUNITY)
Admission: RE | Admit: 2022-02-10 | Discharge: 2022-02-10 | Disposition: A | Discharge status: Home / Self Care | Payer: BC Managed Care – PPO | Source: Ambulatory Visit | Attending: Cardiothoracic Surgery | Admitting: Cardiothoracic Surgery

## 2022-02-10 ENCOUNTER — Other Ambulatory Visit: Payer: Self-pay

## 2022-02-10 ENCOUNTER — Ambulatory Visit (HOSPITAL_COMMUNITY)
Admission: RE | Admit: 2022-02-10 | Discharge: 2022-02-10 | Disposition: A | Discharge status: Home / Self Care | Payer: BC Managed Care – PPO | Source: Ambulatory Visit | Attending: Cardiothoracic Surgery | Admitting: Cardiothoracic Surgery

## 2022-02-10 VITALS — BP 144/87 | HR 71 | Temp 98.1°F | Resp 18 | Ht 64.0 in | Wt 188.5 lb

## 2022-02-10 DIAGNOSIS — I2510 Atherosclerotic heart disease of native coronary artery without angina pectoris: Secondary | ICD-10-CM

## 2022-02-10 DIAGNOSIS — Z01812 Encounter for preprocedural laboratory examination: Secondary | ICD-10-CM | POA: Diagnosis not present

## 2022-02-10 DIAGNOSIS — I25119 Atherosclerotic heart disease of native coronary artery with unspecified angina pectoris: Secondary | ICD-10-CM | POA: Diagnosis not present

## 2022-02-10 DIAGNOSIS — Z20822 Contact with and (suspected) exposure to covid-19: Secondary | ICD-10-CM | POA: Insufficient documentation

## 2022-02-10 LAB — SARS CORONAVIRUS 2 (TAT 6-24 HRS): SARS Coronavirus 2: NEGATIVE

## 2022-02-10 NOTE — Progress Notes (Signed)
Surgical Instructions ? ? ? Your procedure is scheduled on Tuesday April 18th. ? Report to [facility] Main Entrance "A" at 6 A.M., then check in with the Admitting office. ? Call this number if you have problems the morning of surgery: ? [phone] ? ? If you have any questions prior to your surgery date call [phone]: Open Monday-Friday 8am-4pm ? ? ? Remember: ? Do not eat or drink after midnight the night before your surgery ? ?  ? Take these medicines the morning of surgery with A SIP OF WATER:  ? ?Take usual cardiac medications (calcium-channel blockers, beta blockers, digoxin, or nitrates) the morning of surgery with a sip of water (no other liquids). ? ?isosorbide mononitrate (IMDUR) 30 MG 24 hr tablet ? ?Follow your surgeon's instructions on when to stop Aspirin.  If no instructions were given by your surgeon then you will need to call the office to get those instructions.   ? ?As of today, STOP taking any Aspirin (unless otherwise instructed by your surgeon) Aleve, Naproxen, Ibuprofen, Motrin, Advil, Goody's, BC's, all herbal medications, fish oil, and all vitamins. ? ?         ?Do not wear jewelry or makeup ?Do not wear lotions, powders, perfumes, or deodorant. ?Do not shave 48 hours prior to surgery.   ?Do not bring valuables to the hospital. ?Do not wear nail polish, gel polish, artificial nails, or any other type of covering on natural nails (fingers and toes) ?If you have artificial nails or gel coating that need to be removed by a nail salon, please have this removed prior to surgery. Artificial nails or gel coating may interfere with anesthesia's ability to adequately monitor your vital signs. ? ?[facility] is not responsible for any belongings or valuables. .  ? ?Do NOT Smoke (Tobacco/Vaping)  24 hours prior to your procedure ? ?If you use a CPAP at night, you may bring your mask for your overnight stay. ?  ?Contacts, glasses, hearing aids, dentures or partials may not be worn into surgery,  please bring cases for these belongings ?  ?For patients admitted to the hospital, discharge time will be determined by your treatment team. ?  ?Patients discharged the day of surgery will not be allowed to drive home, and someone needs to stay with them for 24 hours. ? ? ?SURGICAL WAITING ROOM VISITATION ?Patients having surgery or a procedure in a hospital may have two support people. ?Children under the age of 56 must have an adult with them who is not the patient. ?They may stay in the waiting area during the procedure and may switch out with other visitors. If the patient needs to stay at the hospital during part of their recovery, the visitor guidelines for inpatient rooms apply. ? ?Please refer to the [facility] website for the visitor guidelines for Inpatients (after your surgery is over and you are in a regular room).  ? ? ? ? ? ?Special instructions:   ? ?Oral Hygiene is also important to reduce your risk of infection.  Remember - BRUSH YOUR TEETH THE MORNING OF SURGERY WITH YOUR REGULAR TOOTHPASTE ? ? ?[facility]- Preparing For Surgery ? ?Before surgery, you can play an important role. Because skin is not sterile, your skin needs to be as free of germs as possible. You can reduce the number of germs on your skin by washing with CHG (chlorahexidine gluconate) Soap before surgery.  CHG is an antiseptic cleaner which kills germs and bonds with the skin  to continue killing germs even after washing.   ? ? ?Please do not use if you have an allergy to CHG or antibacterial soaps. If your skin becomes reddened/irritated stop using the CHG.  ?Do not shave (including legs and underarms) for at least 48 hours prior to first CHG shower. It is OK to shave your face. ? ?Please follow these instructions carefully. ?  ? ? Shower the NIGHT BEFORE SURGERY and the MORNING OF SURGERY with CHG Soap.  ? If you chose to wash your hair, wash your hair first as usual with your normal shampoo. After you shampoo, rinse your hair  and body thoroughly to remove the shampoo.  Then [employer] and genitals (private parts) with your normal soap and rinse thoroughly to remove soap. ? ?After that Use CHG Soap as you would any other liquid soap. You can apply CHG directly to the skin and wash gently with a scrungie or a clean washcloth.  ? ?Apply the CHG Soap to your body ONLY FROM THE NECK DOWN.  Do not use on open wounds or open sores. Avoid contact with your eyes, ears, mouth and genitals (private parts). Wash Face and genitals (private parts)  with your normal soap.  ? ?Wash thoroughly, paying special attention to the area where your surgery will be performed. ? ?Thoroughly rinse your body with warm water from the neck down. ? ?DO NOT shower/wash with your normal soap after using and rinsing off the CHG Soap. ? ?Pat yourself dry with a CLEAN TOWEL. ? ?Wear CLEAN PAJAMAS to bed the night before surgery ? ?Place CLEAN SHEETS on your bed the night before your surgery ? ?DO NOT SLEEP WITH PETS. ? ? ?Day of Surgery: ? ?Take a shower with CHG soap. ?Wear Clean/Comfortable clothing the morning of surgery ?Do not apply any deodorants/lotions.   ?Remember to brush your teeth WITH YOUR REGULAR TOOTHPASTE. ? ? ? ?If you received a COVID test during your pre-op visit  it is requested that you wear a mask when out in public, stay away from anyone that may not be feeling well and notify your surgeon if you develop symptoms. If you have been in contact with anyone that has tested positive in the last 10 days please notify you surgeon. ? ?  ?Please read over the following fact sheets that you were given.  ? ?

## 2022-02-10 NOTE — Progress Notes (Signed)
PCP - Kathyrn Drown NP ?Cardiologist - Will be going to see Shirlee More in [place] on April 27th ?Recent Admission to [facility] on the 10th of April then transferred to [facility] on the 12th of April  ? ?Chest x-ray - 02/09/22 ?EKG - 02/09/22 ?Stress Test - 12/03/21 ?ECHO - 02/09/22 ?Cardiac Cath - 02/08/22 ? ?Sleep Study - Denies ? ?DM  ? ?Aspirin Instructions: per patient instructed by Dr. Prescott Gum to continue through day of surgery.  ? ?COVID TEST- 02/10/22 ? ? ?Anesthesia review: yes cardiac history recent hospitalization ? ?Patient denies shortness of breath, fever, cough and chest pain at PAT appointment ? ? ?All instructions explained to the patient, with a verbal understanding of the material. Patient agrees to go over the instructions while at home for a better understanding. Patient also instructed to wear a mask while in public after being tested for COVID-19. The opportunity to ask questions was provided. ? ? ?

## 2022-02-10 NOTE — Progress Notes (Signed)
Pre cabg has been completed.  ? ?Preliminary results in CV Proc.  ? ?Kristeen Lantz [place] ?02/10/2022 9:40 AM    ?

## 2022-02-13 ENCOUNTER — Telehealth: Payer: BC Managed Care – PPO | Admitting: Cardiothoracic Surgery

## 2022-02-13 ENCOUNTER — Other Ambulatory Visit: Payer: Self-pay | Admitting: Cardiothoracic Surgery

## 2022-02-13 ENCOUNTER — Ambulatory Visit (HOSPITAL_COMMUNITY)
Admission: RE | Admit: 2022-02-13 | Discharge: 2022-02-13 | Disposition: A | Discharge status: Home / Self Care | Payer: BC Managed Care – PPO | Source: Ambulatory Visit | Attending: Cardiothoracic Surgery | Admitting: Cardiothoracic Surgery

## 2022-02-13 DIAGNOSIS — R0609 Other forms of dyspnea: Secondary | ICD-10-CM | POA: Insufficient documentation

## 2022-02-13 DIAGNOSIS — Z87891 Personal history of nicotine dependence: Secondary | ICD-10-CM | POA: Insufficient documentation

## 2022-02-13 LAB — PULMONARY FUNCTION TEST
DL/VA % pred: 83 %
DL/VA: 3.54 ml/min/mmHg/L
DLCO cor % pred: 97 %
DLCO cor: 19.77 ml/min/mmHg
DLCO unc % pred: 101 %
DLCO unc: 20.62 ml/min/mmHg
FEF 25-75 Post: 2.7 L/sec
FEF 25-75 Pre: 1.77 L/sec
FEF2575-%Change-Post: 52 %
FEF2575-%Pred-Post: 114 %
FEF2575-%Pred-Pre: 75 %
FEV1-%Change-Post: 9 %
FEV1-%Pred-Post: 121 %
FEV1-%Pred-Pre: 110 %
FEV1-Post: 3.11 L
FEV1-Pre: 2.83 L
FEV1FVC-%Change-Post: 10 %
FEV1FVC-%Pred-Pre: 90 %
FEV6-%Change-Post: 1 %
FEV6-%Pred-Post: 124 %
FEV6-%Pred-Pre: 122 %
FEV6-Post: 3.97 L
FEV6-Pre: 3.92 L
FEV6FVC-%Change-Post: 2 %
FEV6FVC-%Pred-Post: 103 %
FEV6FVC-%Pred-Pre: 100 %
FVC-%Change-Post: -1 %
FVC-%Pred-Post: 120 %
FVC-%Pred-Pre: 121 %
FVC-Post: 3.98 L
FVC-Pre: 4.03 L
Post FEV1/FVC ratio: 78 %
Post FEV6/FVC ratio: 100 %
Pre FEV1/FVC ratio: 70 %
Pre FEV6/FVC Ratio: 97 %
RV % pred: 141 %
RV: 2.81 L
TLC % pred: 135 %
TLC: 6.84 L

## 2022-02-13 MED ORDER — POTASSIUM CHLORIDE 2 MEQ/ML IV SOLN
80.0000 meq | INTRAVENOUS | Status: DC
  Filled 2022-02-13: qty 40

## 2022-02-13 MED ORDER — ALBUTEROL SULFATE (2.5 MG/3ML) 0.083% IN NEBU
2.5000 mg | INHALATION_SOLUTION | Freq: Once | RESPIRATORY_TRACT | Status: AC
  Administered 2022-02-13: 2.5 mg via RESPIRATORY_TRACT

## 2022-02-13 MED ORDER — CEFAZOLIN SODIUM-DEXTROSE 2-4 GM/100ML-% IV SOLN
2.0000 g | INTRAVENOUS | Status: AC
  Administered 2022-02-14: 2 g via INTRAVENOUS
  Filled 2022-02-13: qty 100

## 2022-02-13 MED ORDER — MAGNESIUM SULFATE 50 % IJ SOLN
40.0000 meq | INTRAMUSCULAR | Status: DC
  Filled 2022-02-13: qty 9.85

## 2022-02-13 MED ORDER — ALPRAZOLAM 1 MG PO TABS: 1.0000 mg | ORAL_TABLET | Freq: Every evening | ORAL | 0 refills | Status: DC | PRN

## 2022-02-13 MED ORDER — TRANEXAMIC ACID (OHS) BOLUS VIA INFUSION
15.0000 mg/kg | INTRAVENOUS | Status: AC
Start: 2022-02-14 — End: 2022-02-14
  Administered 2022-02-14: 1282.5 mg via INTRAVENOUS
  Filled 2022-02-13: qty 1283

## 2022-02-13 MED ORDER — INSULIN REGULAR(HUMAN) IN NACL 100-0.9 UT/100ML-% IV SOLN
INTRAVENOUS | Status: AC
  Administered 2022-02-14: 1.7 [IU]/h via INTRAVENOUS
  Filled 2022-02-13: qty 100

## 2022-02-13 MED ORDER — DEXMEDETOMIDINE HCL IN NACL 400 MCG/100ML IV SOLN
0.1000 ug/kg/h | INTRAVENOUS | Status: AC
  Administered 2022-02-14: .5 ug/kg/h via INTRAVENOUS
  Filled 2022-02-13: qty 100

## 2022-02-13 MED ORDER — SODIUM CHLORIDE 0.9 % IV SOLN
1.5000 mg/kg/h | INTRAVENOUS | Status: AC
  Administered 2022-02-14: 1.5 mg/kg/h via INTRAVENOUS
  Filled 2022-02-13: qty 25

## 2022-02-13 MED ORDER — MILRINONE LACTATE IN DEXTROSE 20-5 MG/100ML-% IV SOLN
0.3000 ug/kg/min | INTRAVENOUS | Status: DC
Start: 2022-02-14 — End: 2022-02-14
  Filled 2022-02-13: qty 100

## 2022-02-13 MED ORDER — EPINEPHRINE HCL 5 MG/250ML IV SOLN IN NS
0.0000 ug/min | INTRAVENOUS | Status: DC
  Filled 2022-02-13: qty 250

## 2022-02-13 MED ORDER — TRANEXAMIC ACID (OHS) PUMP PRIME SOLUTION
2.0000 mg/kg | INTRAVENOUS | Status: DC
  Filled 2022-02-13: qty 1.71

## 2022-02-13 MED ORDER — PLASMA-LYTE A IV SOLN
INTRAVENOUS | Status: DC
  Filled 2022-02-13: qty 2.5

## 2022-02-13 MED ORDER — NITROGLYCERIN IN D5W 200-5 MCG/ML-% IV SOLN
2.0000 ug/min | INTRAVENOUS | Status: AC
  Administered 2022-02-14: 5 ug/min via INTRAVENOUS
  Filled 2022-02-13: qty 250

## 2022-02-13 MED ORDER — PHENYLEPHRINE HCL-NACL 20-0.9 MG/250ML-% IV SOLN
30.0000 ug/min | INTRAVENOUS | Status: AC
  Administered 2022-02-14: 15 ug/min via INTRAVENOUS
  Filled 2022-02-13: qty 250

## 2022-02-13 MED ORDER — NOREPINEPHRINE 4 MG/250ML-% IV SOLN
0.0000 ug/min | INTRAVENOUS | Status: DC
  Filled 2022-02-13: qty 250

## 2022-02-13 MED ORDER — HEPARIN 30,000 UNITS/1000 ML (OHS) CELLSAVER SOLUTION
Status: DC
  Filled 2022-02-13: qty 1000

## 2022-02-13 MED ORDER — VANCOMYCIN HCL 1500 MG/300ML IV SOLN
1500.0000 mg | INTRAVENOUS | Status: AC
  Administered 2022-02-14: 1500 mg via INTRAVENOUS
  Filled 2022-02-13: qty 300

## 2022-02-14 ENCOUNTER — Other Ambulatory Visit: Payer: Self-pay

## 2022-02-14 ENCOUNTER — Inpatient Hospital Stay (HOSPITAL_COMMUNITY)
Admission: RE | Admit: 2022-02-14 | Discharge: 2022-02-19 | DRG: 236 | Disposition: A | Discharge status: Home / Self Care | Payer: BC Managed Care – PPO | Source: Ambulatory Visit | Attending: Cardiothoracic Surgery | Admitting: Cardiothoracic Surgery

## 2022-02-14 ENCOUNTER — Inpatient Hospital Stay (HOSPITAL_COMMUNITY)
Admission: RE | Disposition: A | Discharge status: Home / Self Care | Payer: Self-pay | Source: Ambulatory Visit | Attending: Cardiothoracic Surgery

## 2022-02-14 ENCOUNTER — Encounter (HOSPITAL_COMMUNITY): Payer: Self-pay | Admitting: Cardiothoracic Surgery

## 2022-02-14 ENCOUNTER — Inpatient Hospital Stay (HOSPITAL_COMMUNITY): Payer: BC Managed Care – PPO | Admitting: Physician Assistant

## 2022-02-14 ENCOUNTER — Inpatient Hospital Stay (HOSPITAL_COMMUNITY): Payer: BC Managed Care – PPO

## 2022-02-14 ENCOUNTER — Inpatient Hospital Stay (HOSPITAL_COMMUNITY): Payer: BC Managed Care – PPO | Admitting: Anesthesiology

## 2022-02-14 DIAGNOSIS — I2510 Atherosclerotic heart disease of native coronary artery without angina pectoris: Secondary | ICD-10-CM | POA: Diagnosis not present

## 2022-02-14 DIAGNOSIS — R001 Bradycardia, unspecified: Secondary | ICD-10-CM | POA: Diagnosis not present

## 2022-02-14 DIAGNOSIS — E782 Mixed hyperlipidemia: Secondary | ICD-10-CM | POA: Diagnosis present

## 2022-02-14 DIAGNOSIS — I25110 Atherosclerotic heart disease of native coronary artery with unstable angina pectoris: Secondary | ICD-10-CM | POA: Diagnosis present

## 2022-02-14 DIAGNOSIS — J9811 Atelectasis: Secondary | ICD-10-CM | POA: Diagnosis not present

## 2022-02-14 DIAGNOSIS — I10 Essential (primary) hypertension: Secondary | ICD-10-CM | POA: Diagnosis present

## 2022-02-14 DIAGNOSIS — Z888 Allergy status to other drugs, medicaments and biological substances status: Secondary | ICD-10-CM | POA: Diagnosis not present

## 2022-02-14 DIAGNOSIS — Z951 Presence of aortocoronary bypass graft: Secondary | ICD-10-CM

## 2022-02-14 DIAGNOSIS — E669 Obesity, unspecified: Secondary | ICD-10-CM | POA: Diagnosis present

## 2022-02-14 DIAGNOSIS — Z800 Family history of malignant neoplasm of digestive organs: Secondary | ICD-10-CM | POA: Diagnosis not present

## 2022-02-14 DIAGNOSIS — Z83438 Family history of other disorder of lipoprotein metabolism and other lipidemia: Secondary | ICD-10-CM

## 2022-02-14 DIAGNOSIS — Z885 Allergy status to narcotic agent status: Secondary | ICD-10-CM

## 2022-02-14 DIAGNOSIS — G2581 Restless legs syndrome: Secondary | ICD-10-CM | POA: Diagnosis present

## 2022-02-14 DIAGNOSIS — Z833 Family history of diabetes mellitus: Secondary | ICD-10-CM

## 2022-02-14 DIAGNOSIS — D62 Acute posthemorrhagic anemia: Secondary | ICD-10-CM | POA: Diagnosis not present

## 2022-02-14 DIAGNOSIS — Z7982 Long term (current) use of aspirin: Secondary | ICD-10-CM | POA: Diagnosis not present

## 2022-02-14 DIAGNOSIS — I350 Nonrheumatic aortic (valve) stenosis: Secondary | ICD-10-CM | POA: Diagnosis present

## 2022-02-14 DIAGNOSIS — Z823 Family history of stroke: Secondary | ICD-10-CM

## 2022-02-14 DIAGNOSIS — Z6832 Body mass index (BMI) 32.0-32.9, adult: Secondary | ICD-10-CM

## 2022-02-14 DIAGNOSIS — I25119 Atherosclerotic heart disease of native coronary artery with unspecified angina pectoris: Secondary | ICD-10-CM

## 2022-02-14 DIAGNOSIS — Z8249 Family history of ischemic heart disease and other diseases of the circulatory system: Secondary | ICD-10-CM | POA: Diagnosis not present

## 2022-02-14 DIAGNOSIS — Z87891 Personal history of nicotine dependence: Secondary | ICD-10-CM | POA: Diagnosis not present

## 2022-02-14 DIAGNOSIS — Z79899 Other long term (current) drug therapy: Secondary | ICD-10-CM | POA: Diagnosis not present

## 2022-02-14 DIAGNOSIS — J431 Panlobular emphysema: Secondary | ICD-10-CM | POA: Diagnosis present

## 2022-02-14 HISTORY — PX: CORONARY ARTERY BYPASS GRAFT: SHX141

## 2022-02-14 HISTORY — PX: TEE WITHOUT CARDIOVERSION: SHX5443

## 2022-02-14 HISTORY — DX: Presence of aortocoronary bypass graft: Z95.1

## 2022-02-14 LAB — POCT I-STAT 7, (LYTES, BLD GAS, ICA,H+H)
Acid-Base Excess: 2 mmol/L (ref 0.0–2.0)
Acid-Base Excess: 0 mmol/L (ref 0.0–2.0)
Acid-base deficit: 1 mmol/L (ref 0.0–2.0)
Acid-base deficit: 1 mmol/L (ref 0.0–2.0)
Acid-base deficit: 2 mmol/L (ref 0.0–2.0)
Acid-base deficit: 4 mmol/L — ABNORMAL HIGH (ref 0.0–2.0)
Acid-base deficit: 2 mmol/L (ref 0.0–2.0)
Acid-base deficit: 2 mmol/L (ref 0.0–2.0)
Acid-base deficit: 3 mmol/L — ABNORMAL HIGH (ref 0.0–2.0)
Bicarbonate: 25.8 mmol/L (ref 20.0–28.0)
Bicarbonate: 25.3 mmol/L (ref 20.0–28.0)
Bicarbonate: 23.5 mmol/L (ref 20.0–28.0)
Bicarbonate: 23.7 mmol/L (ref 20.0–28.0)
Bicarbonate: 23.6 mmol/L (ref 20.0–28.0)
Bicarbonate: 23.5 mmol/L (ref 20.0–28.0)
Bicarbonate: 23.5 mmol/L (ref 20.0–28.0)
Bicarbonate: 23.3 mmol/L (ref 20.0–28.0)
Bicarbonate: 22.4 mmol/L (ref 20.0–28.0)
Calcium, Ion: 1.22 mmol/L (ref 1.15–1.40)
Calcium, Ion: 1.06 mmol/L — ABNORMAL LOW (ref 1.15–1.40)
Calcium, Ion: 1.01 mmol/L — ABNORMAL LOW (ref 1.15–1.40)
Calcium, Ion: 1.05 mmol/L — ABNORMAL LOW (ref 1.15–1.40)
Calcium, Ion: 1.05 mmol/L — ABNORMAL LOW (ref 1.15–1.40)
Calcium, Ion: 1.12 mmol/L — ABNORMAL LOW (ref 1.15–1.40)
Calcium, Ion: 1.07 mmol/L — ABNORMAL LOW (ref 1.15–1.40)
Calcium, Ion: 1.1 mmol/L — ABNORMAL LOW (ref 1.15–1.40)
Calcium, Ion: 1.09 mmol/L — ABNORMAL LOW (ref 1.15–1.40)
HCT: 35 % — ABNORMAL LOW (ref 36.0–46.0)
HCT: 27 % — ABNORMAL LOW (ref 36.0–46.0)
HCT: 26 % — ABNORMAL LOW (ref 36.0–46.0)
HCT: 27 % — ABNORMAL LOW (ref 36.0–46.0)
HCT: 27 % — ABNORMAL LOW (ref 36.0–46.0)
HCT: 30 % — ABNORMAL LOW (ref 36.0–46.0)
HCT: 27 % — ABNORMAL LOW (ref 36.0–46.0)
HCT: 28 % — ABNORMAL LOW (ref 36.0–46.0)
HCT: 26 % — ABNORMAL LOW (ref 36.0–46.0)
Hemoglobin: 11.9 g/dL — ABNORMAL LOW (ref 12.0–15.0)
Hemoglobin: 9.2 g/dL — ABNORMAL LOW (ref 12.0–15.0)
Hemoglobin: 8.8 g/dL — ABNORMAL LOW (ref 12.0–15.0)
Hemoglobin: 9.2 g/dL — ABNORMAL LOW (ref 12.0–15.0)
Hemoglobin: 9.2 g/dL — ABNORMAL LOW (ref 12.0–15.0)
Hemoglobin: 10.2 g/dL — ABNORMAL LOW (ref 12.0–15.0)
Hemoglobin: 9.2 g/dL — ABNORMAL LOW (ref 12.0–15.0)
Hemoglobin: 9.5 g/dL — ABNORMAL LOW (ref 12.0–15.0)
Hemoglobin: 8.8 g/dL — ABNORMAL LOW (ref 12.0–15.0)
O2 Saturation: 100 %
O2 Saturation: 100 %
O2 Saturation: 100 %
O2 Saturation: 100 %
O2 Saturation: 99 %
O2 Saturation: 93 %
O2 Saturation: 95 %
O2 Saturation: 98 %
O2 Saturation: 92 %
Patient temperature: 35.4
Patient temperature: 35.7
Patient temperature: 37.7
Patient temperature: 37.9
Potassium: 3.5 mmol/L (ref 3.5–5.1)
Potassium: 4 mmol/L (ref 3.5–5.1)
Potassium: 3.6 mmol/L (ref 3.5–5.1)
Potassium: 3.9 mmol/L (ref 3.5–5.1)
Potassium: 3.8 mmol/L (ref 3.5–5.1)
Potassium: 3.8 mmol/L (ref 3.5–5.1)
Potassium: 3.9 mmol/L (ref 3.5–5.1)
Potassium: 4.5 mmol/L (ref 3.5–5.1)
Potassium: 3.8 mmol/L (ref 3.5–5.1)
Sodium: 142 mmol/L (ref 135–145)
Sodium: 140 mmol/L (ref 135–145)
Sodium: 139 mmol/L (ref 135–145)
Sodium: 140 mmol/L (ref 135–145)
Sodium: 142 mmol/L (ref 135–145)
Sodium: 142 mmol/L (ref 135–145)
Sodium: 143 mmol/L (ref 135–145)
Sodium: 141 mmol/L (ref 135–145)
Sodium: 142 mmol/L (ref 135–145)
TCO2: 27 mmol/L (ref 22–32)
TCO2: 26 mmol/L (ref 22–32)
TCO2: 24 mmol/L (ref 22–32)
TCO2: 25 mmol/L (ref 22–32)
TCO2: 25 mmol/L (ref 22–32)
TCO2: 25 mmol/L (ref 22–32)
TCO2: 25 mmol/L (ref 22–32)
TCO2: 25 mmol/L (ref 22–32)
TCO2: 24 mmol/L (ref 22–32)
pCO2 arterial: 52.3 mmHg — ABNORMAL HIGH (ref 32–48)
pCO2 arterial: 34.9 mmHg (ref 32–48)
pCO2 arterial: 31.5 mmHg — ABNORMAL LOW (ref 32–48)
pCO2 arterial: 40.5 mmHg (ref 32–48)
pCO2 arterial: 45.6 mmHg (ref 32–48)
pCO2 arterial: 47.7 mmHg (ref 32–48)
pCO2 arterial: 41.3 mmHg (ref 32–48)
pCO2 arterial: 41.5 mmHg (ref 32–48)
pCO2 arterial: 44.2 mmHg (ref 32–48)
pH, Arterial: 7.301 — ABNORMAL LOW (ref 7.35–7.45)
pH, Arterial: 7.468 — ABNORMAL HIGH (ref 7.35–7.45)
pH, Arterial: 7.374 (ref 7.35–7.45)
pH, Arterial: 7.48 — ABNORMAL HIGH (ref 7.35–7.45)
pH, Arterial: 7.322 — ABNORMAL LOW (ref 7.35–7.45)
pH, Arterial: 7.291 — ABNORMAL LOW (ref 7.35–7.45)
pH, Arterial: 7.357 (ref 7.35–7.45)
pH, Arterial: 7.361 (ref 7.35–7.45)
pH, Arterial: 7.316 — ABNORMAL LOW (ref 7.35–7.45)
pO2, Arterial: 318 mmHg — ABNORMAL HIGH (ref 83–108)
pO2, Arterial: 359 mmHg — ABNORMAL HIGH (ref 83–108)
pO2, Arterial: 186 mmHg — ABNORMAL HIGH (ref 83–108)
pO2, Arterial: 264 mmHg — ABNORMAL HIGH (ref 83–108)
pO2, Arterial: 174 mmHg — ABNORMAL HIGH (ref 83–108)
pO2, Arterial: 68 mmHg — ABNORMAL LOW (ref 83–108)
pO2, Arterial: 72 mmHg — ABNORMAL LOW (ref 83–108)
pO2, Arterial: 108 mmHg (ref 83–108)
pO2, Arterial: 74 mmHg — ABNORMAL LOW (ref 83–108)

## 2022-02-14 LAB — HEMOGLOBIN AND HEMATOCRIT, BLOOD
HCT: 26.8 % — ABNORMAL LOW (ref 36.0–46.0)
Hemoglobin: 9.5 g/dL — ABNORMAL LOW (ref 12.0–15.0)

## 2022-02-14 LAB — POCT I-STAT, CHEM 8
BUN: 16 mg/dL (ref 6–20)
BUN: 13 mg/dL (ref 6–20)
BUN: 13 mg/dL (ref 6–20)
BUN: 13 mg/dL (ref 6–20)
BUN: 13 mg/dL (ref 6–20)
Calcium, Ion: 1.23 mmol/L (ref 1.15–1.40)
Calcium, Ion: 1.15 mmol/L (ref 1.15–1.40)
Calcium, Ion: 1.03 mmol/L — ABNORMAL LOW (ref 1.15–1.40)
Calcium, Ion: 1.05 mmol/L — ABNORMAL LOW (ref 1.15–1.40)
Calcium, Ion: 1.04 mmol/L — ABNORMAL LOW (ref 1.15–1.40)
Chloride: 104 mmol/L (ref 98–111)
Chloride: 106 mmol/L (ref 98–111)
Chloride: 104 mmol/L (ref 98–111)
Chloride: 104 mmol/L (ref 98–111)
Chloride: 104 mmol/L (ref 98–111)
Creatinine, Ser: 0.3 mg/dL — ABNORMAL LOW (ref 0.44–1.00)
Creatinine, Ser: 0.3 mg/dL — ABNORMAL LOW (ref 0.44–1.00)
Creatinine, Ser: 0.3 mg/dL — ABNORMAL LOW (ref 0.44–1.00)
Creatinine, Ser: 0.3 mg/dL — ABNORMAL LOW (ref 0.44–1.00)
Creatinine, Ser: 0.3 mg/dL — ABNORMAL LOW (ref 0.44–1.00)
Glucose, Bld: 140 mg/dL — ABNORMAL HIGH (ref 70–99)
Glucose, Bld: 99 mg/dL (ref 70–99)
Glucose, Bld: 143 mg/dL — ABNORMAL HIGH (ref 70–99)
Glucose, Bld: 146 mg/dL — ABNORMAL HIGH (ref 70–99)
Glucose, Bld: 130 mg/dL — ABNORMAL HIGH (ref 70–99)
HCT: 34 % — ABNORMAL LOW (ref 36.0–46.0)
HCT: 30 % — ABNORMAL LOW (ref 36.0–46.0)
HCT: 27 % — ABNORMAL LOW (ref 36.0–46.0)
HCT: 26 % — ABNORMAL LOW (ref 36.0–46.0)
HCT: 26 % — ABNORMAL LOW (ref 36.0–46.0)
Hemoglobin: 11.6 g/dL — ABNORMAL LOW (ref 12.0–15.0)
Hemoglobin: 10.2 g/dL — ABNORMAL LOW (ref 12.0–15.0)
Hemoglobin: 9.2 g/dL — ABNORMAL LOW (ref 12.0–15.0)
Hemoglobin: 8.8 g/dL — ABNORMAL LOW (ref 12.0–15.0)
Hemoglobin: 8.8 g/dL — ABNORMAL LOW (ref 12.0–15.0)
Potassium: 3.4 mmol/L — ABNORMAL LOW (ref 3.5–5.1)
Potassium: 3.3 mmol/L — ABNORMAL LOW (ref 3.5–5.1)
Potassium: 4 mmol/L (ref 3.5–5.1)
Potassium: 3.9 mmol/L (ref 3.5–5.1)
Potassium: 3.7 mmol/L (ref 3.5–5.1)
Sodium: 141 mmol/L (ref 135–145)
Sodium: 141 mmol/L (ref 135–145)
Sodium: 139 mmol/L (ref 135–145)
Sodium: 140 mmol/L (ref 135–145)
Sodium: 141 mmol/L (ref 135–145)
TCO2: 27 mmol/L (ref 22–32)
TCO2: 24 mmol/L (ref 22–32)
TCO2: 26 mmol/L (ref 22–32)
TCO2: 24 mmol/L (ref 22–32)
TCO2: 25 mmol/L (ref 22–32)

## 2022-02-14 LAB — BASIC METABOLIC PANEL
Anion gap: 6 (ref 5–15)
BUN: 13 mg/dL (ref 6–20)
CO2: 19 mmol/L — ABNORMAL LOW (ref 22–32)
Calcium: 8.3 mg/dL — ABNORMAL LOW (ref 8.9–10.3)
Chloride: 115 mmol/L — ABNORMAL HIGH (ref 98–111)
Creatinine, Ser: 1.03 mg/dL — ABNORMAL HIGH (ref 0.44–1.00)
GFR, Estimated: >60 mL/min (ref >60)
Glucose, Bld: 159 mg/dL — ABNORMAL HIGH (ref 70–99)
Potassium: 4 mmol/L (ref 3.5–5.1)
Sodium: 140 mmol/L (ref 135–145)

## 2022-02-14 LAB — CBC
HCT: 30 % — ABNORMAL LOW (ref 36.0–46.0)
HCT: 32.7 % — ABNORMAL LOW (ref 36.0–46.0)
Hemoglobin: 10.6 g/dL — ABNORMAL LOW (ref 12.0–15.0)
Hemoglobin: 10.5 g/dL — ABNORMAL LOW (ref 12.0–15.0)
MCH: 32.4 pg (ref 26.0–34.0)
MCH: 29.7 pg (ref 26.0–34.0)
MCHC: 35.3 g/dL (ref 30.0–36.0)
MCHC: 32.1 g/dL (ref 30.0–36.0)
MCV: 91.7 fL (ref 80.0–100.0)
MCV: 92.4 fL (ref 80.0–100.0)
Platelets: 151 10*3/uL (ref 150–400)
Platelets: 179 10*3/uL (ref 150–400)
RBC: 3.27 MIL/uL — ABNORMAL LOW (ref 3.87–5.11)
RBC: 3.54 MIL/uL — ABNORMAL LOW (ref 3.87–5.11)
RDW: 12 % (ref 11.5–15.5)
RDW: 14.1 % (ref 11.5–15.5)
WBC: 10.8 10*3/uL — ABNORMAL HIGH (ref 4.0–10.5)
WBC: 7.7 10*3/uL (ref 4.0–10.5)
nRBC: 0 % (ref 0.0–0.2)
nRBC: 0 % (ref 0.0–0.2)

## 2022-02-14 LAB — POCT I-STAT EG7
Acid-Base Excess: 5 mmol/L — ABNORMAL HIGH (ref 0.0–2.0)
Bicarbonate: 30.2 mmol/L — ABNORMAL HIGH (ref 20.0–28.0)
Calcium, Ion: 1 mmol/L — ABNORMAL LOW (ref 1.15–1.40)
HCT: 26 % — ABNORMAL LOW (ref 36.0–46.0)
Hemoglobin: 8.8 g/dL — ABNORMAL LOW (ref 12.0–15.0)
O2 Saturation: 83 %
Potassium: 3.3 mmol/L — ABNORMAL LOW (ref 3.5–5.1)
Sodium: 145 mmol/L (ref 135–145)
TCO2: 32 mmol/L (ref 22–32)
pCO2, Ven: 47.7 mmHg (ref 44–60)
pH, Ven: 7.409 (ref 7.25–7.43)
pO2, Ven: 48 mmHg — ABNORMAL HIGH (ref 32–45)

## 2022-02-14 LAB — GLUCOSE, CAPILLARY
Glucose-Capillary: 107 mg/dL — ABNORMAL HIGH (ref 70–99)
Glucose-Capillary: 123 mg/dL — ABNORMAL HIGH (ref 70–99)
Glucose-Capillary: 131 mg/dL — ABNORMAL HIGH (ref 70–99)
Glucose-Capillary: 132 mg/dL — ABNORMAL HIGH (ref 70–99)
Glucose-Capillary: 134 mg/dL — ABNORMAL HIGH (ref 70–99)
Glucose-Capillary: 135 mg/dL — ABNORMAL HIGH (ref 70–99)
Glucose-Capillary: 137 mg/dL — ABNORMAL HIGH (ref 70–99)
Glucose-Capillary: 137 mg/dL — ABNORMAL HIGH (ref 70–99)
Glucose-Capillary: 140 mg/dL — ABNORMAL HIGH (ref 70–99)
Glucose-Capillary: 143 mg/dL — ABNORMAL HIGH (ref 70–99)

## 2022-02-14 LAB — PROTIME-INR
INR: 1.4 — ABNORMAL HIGH (ref 0.8–1.2)
Prothrombin Time: 16.6 seconds — ABNORMAL HIGH (ref 11.4–15.2)

## 2022-02-14 LAB — PLATELET COUNT: Platelets: 195 10*3/uL (ref 150–400)

## 2022-02-14 LAB — ABO/RH: ABO/RH(D): O POS

## 2022-02-14 LAB — APTT: aPTT: 30 seconds (ref 24–36)

## 2022-02-14 LAB — MAGNESIUM: Magnesium: 1.5 mg/dL — ABNORMAL LOW (ref 1.7–2.4)

## 2022-02-14 LAB — PREPARE RBC (CROSSMATCH)

## 2022-02-14 SURGERY — CORONARY ARTERY BYPASS GRAFTING (CABG)
Anesthesia: General | Site: Chest

## 2022-02-14 MED ORDER — ROCURONIUM BROMIDE 10 MG/ML (PF) SYRINGE
PREFILLED_SYRINGE | INTRAVENOUS | Status: AC
  Filled 2022-02-14: qty 10

## 2022-02-14 MED ORDER — FENTANYL CITRATE (PF) 250 MCG/5ML IJ SOLN
INTRAMUSCULAR | Status: AC
  Filled 2022-02-14: qty 5

## 2022-02-14 MED ORDER — LACTATED RINGERS IV SOLN: INTRAVENOUS | Status: DC | PRN

## 2022-02-14 MED ORDER — ORAL CARE MOUTH RINSE: 15.0000 mL | Freq: Once | OROMUCOSAL | Status: DC

## 2022-02-14 MED ORDER — LACTATED RINGERS IV SOLN: INTRAVENOUS | Status: DC

## 2022-02-14 MED ORDER — LACTATED RINGERS IV SOLN: 500.0000 mL | Freq: Once | INTRAVENOUS | Status: DC | PRN

## 2022-02-14 MED ORDER — ATORVASTATIN CALCIUM 80 MG PO TABS
80.0000 mg | ORAL_TABLET | Freq: Every day | ORAL | Status: DC
  Administered 2022-02-15 – 2022-02-19 (×5): 80 mg via ORAL
  Filled 2022-02-14 (×5): qty 1

## 2022-02-14 MED ORDER — CHLORHEXIDINE GLUCONATE 4 % EX LIQD: 30.0000 mL | CUTANEOUS | Status: DC

## 2022-02-14 MED ORDER — CHLORHEXIDINE GLUCONATE 0.12 % MT SOLN: 15.0000 mL | Freq: Once | OROMUCOSAL | Status: DC

## 2022-02-14 MED ORDER — POTASSIUM CHLORIDE 10 MEQ/50ML IV SOLN
10.0000 meq | INTRAVENOUS | Status: AC
  Administered 2022-02-14 (×3): 10 meq via INTRAVENOUS

## 2022-02-14 MED ORDER — PROTAMINE SULFATE 10 MG/ML IV SOLN
INTRAVENOUS | Status: AC
  Filled 2022-02-14: qty 25

## 2022-02-14 MED ORDER — SODIUM CHLORIDE 0.9 % IV SOLN: INTRAVENOUS | Status: DC | PRN

## 2022-02-14 MED ORDER — CHLORHEXIDINE GLUCONATE 0.12 % MT SOLN
OROMUCOSAL | Status: AC
Start: 2022-02-14 — End: 2022-02-14
  Filled 2022-02-14: qty 15

## 2022-02-14 MED ORDER — MIDAZOLAM HCL (PF) 10 MG/2ML IJ SOLN
INTRAMUSCULAR | Status: AC
  Filled 2022-02-14: qty 2

## 2022-02-14 MED ORDER — CHLORHEXIDINE GLUCONATE CLOTH 2 % EX PADS
6.0000 | MEDICATED_PAD | Freq: Every day | CUTANEOUS | Status: DC
  Administered 2022-02-15 – 2022-02-19 (×5): 6 via TOPICAL

## 2022-02-14 MED ORDER — NITROGLYCERIN IN D5W 200-5 MCG/ML-% IV SOLN: 0.0000 ug/min | INTRAVENOUS | Status: DC

## 2022-02-14 MED ORDER — ACETAMINOPHEN 160 MG/5ML PO SOLN: 1000.0000 mg | Freq: Four times a day (QID) | ORAL | Status: DC

## 2022-02-14 MED ORDER — OXYCODONE HCL 5 MG PO TABS: 5.0000 mg | ORAL_TABLET | ORAL | Status: DC | PRN

## 2022-02-14 MED ORDER — METOPROLOL TARTRATE 12.5 MG HALF TABLET
12.5000 mg | ORAL_TABLET | Freq: Two times a day (BID) | ORAL | Status: DC
  Administered 2022-02-15: 12.5 mg via ORAL
  Filled 2022-02-14: qty 1

## 2022-02-14 MED ORDER — ASPIRIN EC 325 MG PO TBEC
325.0000 mg | DELAYED_RELEASE_TABLET | Freq: Every day | ORAL | Status: DC
  Administered 2022-02-15 – 2022-02-19 (×5): 325 mg via ORAL
  Filled 2022-02-14 (×5): qty 1

## 2022-02-14 MED ORDER — HEPARIN SODIUM (PORCINE) 1000 UNIT/ML IJ SOLN
INTRAMUSCULAR | Status: AC
  Filled 2022-02-14: qty 1

## 2022-02-14 MED ORDER — CEFAZOLIN SODIUM-DEXTROSE 2-4 GM/100ML-% IV SOLN
2.0000 g | Freq: Three times a day (TID) | INTRAVENOUS | Status: AC
  Administered 2022-02-14 – 2022-02-16 (×6): 2 g via INTRAVENOUS
  Filled 2022-02-14 (×6): qty 100

## 2022-02-14 MED ORDER — ALBUMIN HUMAN 5 % IV SOLN
250.0000 mL | INTRAVENOUS | Status: AC | PRN
  Administered 2022-02-14 – 2022-02-15 (×3): 12.5 g via INTRAVENOUS
  Filled 2022-02-14: qty 250

## 2022-02-14 MED ORDER — SODIUM CHLORIDE 0.9% FLUSH
3.0000 mL | Freq: Two times a day (BID) | INTRAVENOUS | Status: DC
  Administered 2022-02-15: 3 mL via INTRAVENOUS

## 2022-02-14 MED ORDER — ONDANSETRON HCL 4 MG/2ML IJ SOLN
4.0000 mg | Freq: Four times a day (QID) | INTRAMUSCULAR | Status: DC | PRN
  Administered 2022-02-14: 4 mg via INTRAVENOUS
  Filled 2022-02-14: qty 2

## 2022-02-14 MED ORDER — METOPROLOL TARTRATE 12.5 MG HALF TABLET: 12.5000 mg | ORAL_TABLET | Freq: Once | ORAL | Status: DC

## 2022-02-14 MED ORDER — ALBUMIN HUMAN 5 % IV SOLN
INTRAVENOUS | Status: DC | PRN
Start: 2022-02-14 — End: 2022-02-14

## 2022-02-14 MED ORDER — SODIUM CHLORIDE 0.9% FLUSH
10.0000 mL | Freq: Two times a day (BID) | INTRAVENOUS | Status: DC
  Administered 2022-02-14 – 2022-02-19 (×10): 10 mL

## 2022-02-14 MED ORDER — SODIUM CHLORIDE 0.9% FLUSH: 10.0000 mL | INTRAVENOUS | Status: DC | PRN

## 2022-02-14 MED ORDER — PANTOPRAZOLE SODIUM 40 MG PO TBEC
40.0000 mg | DELAYED_RELEASE_TABLET | Freq: Every day | ORAL | Status: DC
  Administered 2022-02-15 – 2022-02-19 (×5): 40 mg via ORAL
  Filled 2022-02-14 (×5): qty 1

## 2022-02-14 MED ORDER — PLASMA-LYTE A IV SOLN
INTRAVENOUS | Status: DC | PRN
  Administered 2022-02-14: 500 mL via INTRAVASCULAR

## 2022-02-14 MED ORDER — CHLORHEXIDINE GLUCONATE 0.12 % MT SOLN
15.0000 mL | Freq: Once | OROMUCOSAL | Status: AC
  Administered 2022-02-14: 15 mL via OROMUCOSAL

## 2022-02-14 MED ORDER — ACETAMINOPHEN 650 MG RE SUPP
650.0000 mg | Freq: Once | RECTAL | Status: AC
  Administered 2022-02-14: 650 mg via RECTAL

## 2022-02-14 MED ORDER — VANCOMYCIN HCL IN DEXTROSE 1-5 GM/200ML-% IV SOLN
1000.0000 mg | Freq: Once | INTRAVENOUS | Status: AC
  Administered 2022-02-14: 1000 mg via INTRAVENOUS
  Filled 2022-02-14: qty 200

## 2022-02-14 MED ORDER — SODIUM CHLORIDE 0.9% FLUSH: 3.0000 mL | INTRAVENOUS | Status: DC | PRN

## 2022-02-14 MED ORDER — FENTANYL CITRATE PF 50 MCG/ML IJ SOSY
50.0000 ug | PREFILLED_SYRINGE | INTRAMUSCULAR | Status: DC | PRN
  Administered 2022-02-14 (×2): 50 ug via INTRAVENOUS
  Administered 2022-02-15: 100 ug via INTRAVENOUS
  Administered 2022-02-15 (×2): 50 ug via INTRAVENOUS
  Filled 2022-02-14 (×2): qty 1
  Filled 2022-02-14: qty 2
  Filled 2022-02-14 (×2): qty 1

## 2022-02-14 MED ORDER — ACETAMINOPHEN 160 MG/5ML PO SOLN: 650.0000 mg | Freq: Once | ORAL | Status: AC

## 2022-02-14 MED ORDER — ROCURONIUM BROMIDE 10 MG/ML (PF) SYRINGE
PREFILLED_SYRINGE | INTRAVENOUS | Status: AC
  Filled 2022-02-14: qty 20

## 2022-02-14 MED ORDER — ASPIRIN 81 MG PO CHEW: 324.0000 mg | CHEWABLE_TABLET | Freq: Every day | ORAL | Status: DC

## 2022-02-14 MED ORDER — MIDAZOLAM HCL (PF) 5 MG/ML IJ SOLN
INTRAMUSCULAR | Status: DC | PRN
  Administered 2022-02-14 (×2): 1 mg via INTRAVENOUS
  Administered 2022-02-14 (×2): 2 mg via INTRAVENOUS

## 2022-02-14 MED ORDER — ROCURONIUM BROMIDE 10 MG/ML (PF) SYRINGE
PREFILLED_SYRINGE | INTRAVENOUS | Status: DC | PRN
  Administered 2022-02-14: 30 mg via INTRAVENOUS
  Administered 2022-02-14 (×3): 50 mg via INTRAVENOUS
  Administered 2022-02-14: 30 mg via INTRAVENOUS
  Administered 2022-02-14: 70 mg via INTRAVENOUS

## 2022-02-14 MED ORDER — FENTANYL CITRATE (PF) 250 MCG/5ML IJ SOLN
INTRAMUSCULAR | Status: DC | PRN
  Administered 2022-02-14 (×3): 100 ug via INTRAVENOUS
  Administered 2022-02-14: 50 ug via INTRAVENOUS
  Administered 2022-02-14 (×2): 100 ug via INTRAVENOUS
  Administered 2022-02-14: 50 ug via INTRAVENOUS
  Administered 2022-02-14: 200 ug via INTRAVENOUS
  Administered 2022-02-14 (×3): 100 ug via INTRAVENOUS

## 2022-02-14 MED ORDER — PROPOFOL 10 MG/ML IV BOLUS
INTRAVENOUS | Status: DC | PRN
  Administered 2022-02-14: 80 mg via INTRAVENOUS

## 2022-02-14 MED ORDER — ~~LOC~~ CARDIAC SURGERY, PATIENT & FAMILY EDUCATION
Freq: Once | Status: DC
  Filled 2022-02-14: qty 1

## 2022-02-14 MED ORDER — 0.9 % SODIUM CHLORIDE (POUR BTL) OPTIME
TOPICAL | Status: DC | PRN
  Administered 2022-02-14: 5000 mL

## 2022-02-14 MED ORDER — INSULIN REGULAR(HUMAN) IN NACL 100-0.9 UT/100ML-% IV SOLN: INTRAVENOUS | Status: DC

## 2022-02-14 MED ORDER — BISACODYL 5 MG PO TBEC
10.0000 mg | DELAYED_RELEASE_TABLET | Freq: Every day | ORAL | Status: DC
  Administered 2022-02-15 – 2022-02-17 (×3): 10 mg via ORAL
  Filled 2022-02-14 (×4): qty 2

## 2022-02-14 MED ORDER — FAMOTIDINE IN NACL 20-0.9 MG/50ML-% IV SOLN
20.0000 mg | Freq: Two times a day (BID) | INTRAVENOUS | Status: DC
  Administered 2022-02-14: 20 mg via INTRAVENOUS
  Filled 2022-02-14: qty 50

## 2022-02-14 MED ORDER — MIDAZOLAM HCL 2 MG/2ML IJ SOLN: 2.0000 mg | INTRAMUSCULAR | Status: DC | PRN

## 2022-02-14 MED ORDER — PHENYLEPHRINE 80 MCG/ML (10ML) SYRINGE FOR IV PUSH (FOR BLOOD PRESSURE SUPPORT)
PREFILLED_SYRINGE | INTRAVENOUS | Status: AC
  Filled 2022-02-14: qty 10

## 2022-02-14 MED ORDER — PHENYLEPHRINE 80 MCG/ML (10ML) SYRINGE FOR IV PUSH (FOR BLOOD PRESSURE SUPPORT)
PREFILLED_SYRINGE | INTRAVENOUS | Status: DC | PRN
  Administered 2022-02-14: 40 ug via INTRAVENOUS
  Administered 2022-02-14: 80 ug via INTRAVENOUS
  Administered 2022-02-14: 40 ug via INTRAVENOUS

## 2022-02-14 MED ORDER — BISACODYL 10 MG RE SUPP
10.0000 mg | Freq: Every day | RECTAL | Status: DC
  Filled 2022-02-14: qty 1

## 2022-02-14 MED ORDER — SODIUM BICARBONATE 8.4 % IV SOLN
50.0000 meq | Freq: Once | INTRAVENOUS | Status: AC
  Administered 2022-02-14: 50 meq via INTRAVENOUS

## 2022-02-14 MED ORDER — ACETAMINOPHEN 500 MG PO TABS
1000.0000 mg | ORAL_TABLET | Freq: Four times a day (QID) | ORAL | Status: DC
  Administered 2022-02-14 – 2022-02-19 (×17): 1000 mg via ORAL
  Filled 2022-02-14 (×18): qty 2

## 2022-02-14 MED ORDER — DEXTROSE 50 % IV SOLN: 0.0000 mL | INTRAVENOUS | Status: DC | PRN

## 2022-02-14 MED ORDER — HEPARIN SODIUM (PORCINE) 1000 UNIT/ML IJ SOLN
INTRAMUSCULAR | Status: DC | PRN
  Administered 2022-02-14: 25000 [IU] via INTRAVENOUS
  Administered 2022-02-14: 3000 [IU] via INTRAVENOUS

## 2022-02-14 MED ORDER — MAGNESIUM SULFATE 4 GM/100ML IV SOLN
4.0000 g | Freq: Once | INTRAVENOUS | Status: AC
  Administered 2022-02-14: 4 g via INTRAVENOUS
  Filled 2022-02-14: qty 100

## 2022-02-14 MED ORDER — PROPOFOL 10 MG/ML IV BOLUS
INTRAVENOUS | Status: AC
  Filled 2022-02-14: qty 20

## 2022-02-14 MED ORDER — METOPROLOL TARTRATE 25 MG/10 ML ORAL SUSPENSION
12.5000 mg | Freq: Two times a day (BID) | ORAL | Status: DC
  Administered 2022-02-14: 12.5 mg
  Filled 2022-02-14: qty 5

## 2022-02-14 MED ORDER — SODIUM CHLORIDE 0.9 % IV SOLN: INTRAVENOUS | Status: DC

## 2022-02-14 MED ORDER — DEXMEDETOMIDINE HCL IN NACL 400 MCG/100ML IV SOLN
0.0000 ug/kg/h | INTRAVENOUS | Status: DC
  Administered 2022-02-14: 0.2 ug/kg/h via INTRAVENOUS
  Filled 2022-02-14: qty 100

## 2022-02-14 MED ORDER — METOPROLOL TARTRATE 5 MG/5ML IV SOLN: 2.5000 mg | INTRAVENOUS | Status: DC | PRN

## 2022-02-14 MED ORDER — SURGIFLO WITH THROMBIN (HEMOSTATIC MATRIX KIT) OPTIME
TOPICAL | Status: DC | PRN
  Administered 2022-02-14: 1 via TOPICAL

## 2022-02-14 MED ORDER — HYDRALAZINE HCL 20 MG/ML IJ SOLN
10.0000 mg | INTRAMUSCULAR | Status: DC | PRN
  Administered 2022-02-19: 10 mg via INTRAVENOUS
  Filled 2022-02-14: qty 1

## 2022-02-14 MED ORDER — DOCUSATE SODIUM 100 MG PO CAPS
200.0000 mg | ORAL_CAPSULE | Freq: Every day | ORAL | Status: DC
  Administered 2022-02-15 – 2022-02-17 (×3): 200 mg via ORAL
  Filled 2022-02-14 (×4): qty 2

## 2022-02-14 MED ORDER — PHENYLEPHRINE HCL-NACL 20-0.9 MG/250ML-% IV SOLN: 0.0000 ug/min | INTRAVENOUS | Status: DC

## 2022-02-14 MED ORDER — SODIUM CHLORIDE 0.45 % IV SOLN: INTRAVENOUS | Status: DC | PRN

## 2022-02-14 MED ORDER — SODIUM CHLORIDE 0.9 % IV SOLN: 250.0000 mL | INTRAVENOUS | Status: DC

## 2022-02-14 MED ORDER — CHLORHEXIDINE GLUCONATE 0.12 % MT SOLN
15.0000 mL | OROMUCOSAL | Status: AC
  Administered 2022-02-14: 15 mL via OROMUCOSAL

## 2022-02-14 MED ORDER — PROTAMINE SULFATE 10 MG/ML IV SOLN
INTRAVENOUS | Status: DC | PRN
  Administered 2022-02-14: 280 mg via INTRAVENOUS

## 2022-02-14 MED ORDER — HEMOSTATIC AGENTS (NO CHARGE) OPTIME
TOPICAL | Status: DC | PRN
Start: 2022-02-14 — End: 2022-02-14
  Administered 2022-02-14: 1 via TOPICAL
  Administered 2022-02-14: 4 via TOPICAL

## 2022-02-14 SURGICAL SUPPLY — 96 items
ADAPTER CARDIO PERF ANTE/RETRO (ADAPTER) ×3 IMPLANT
BAG DECANTER FOR FLEXI CONT (MISCELLANEOUS) ×3 IMPLANT
BLADE CLIPPER SURG (BLADE) ×3 IMPLANT
BLADE STERNUM SYSTEM 6 (BLADE) ×3 IMPLANT
BLADE SURG 12 STRL SS (BLADE) ×3 IMPLANT
BNDG ELASTIC 4X5.8 VLCR STR LF (GAUZE/BANDAGES/DRESSINGS) ×3 IMPLANT
BNDG ELASTIC 6X5.8 VLCR STR LF (GAUZE/BANDAGES/DRESSINGS) ×3 IMPLANT
BNDG GAUZE ELAST 4 BULKY (GAUZE/BANDAGES/DRESSINGS) ×3 IMPLANT
CANISTER SUCT 3000ML PPV (MISCELLANEOUS) ×3 IMPLANT
CANNULA GUNDRY RCSP 15FR (MISCELLANEOUS) ×3 IMPLANT
CATH CPB KIT VANTRIGT (MISCELLANEOUS) ×3 IMPLANT
CATH ROBINSON RED A/P 18FR (CATHETERS) ×9 IMPLANT
CATH THORACIC 28FR RT ANG (CATHETERS) ×3 IMPLANT
CLIP FOGARTY SPRING 6M (CLIP) ×1 IMPLANT
CLIP RETRACTION 3.0MM CORONARY (MISCELLANEOUS) ×1 IMPLANT
CONTAINER PROTECT SURGISLUSH (MISCELLANEOUS) ×6 IMPLANT
DERMABOND ADVANCED (GAUZE/BANDAGES/DRESSINGS) ×1
DERMABOND ADVANCED .7 DNX12 (GAUZE/BANDAGES/DRESSINGS) IMPLANT
DRAIN CHANNEL 32F RND 10.7 FF (WOUND CARE) ×3 IMPLANT
DRAPE CARDIOVASCULAR INCISE (DRAPES) ×3
DRAPE SLUSH/WARMER DISC (DRAPES) ×3 IMPLANT
DRAPE SRG 135X102X78XABS (DRAPES) ×2 IMPLANT
DRSG AQUACEL AG ADV 3.5X14 (GAUZE/BANDAGES/DRESSINGS) ×3 IMPLANT
ELECT BLADE 4.0 EZ CLEAN MEGAD (MISCELLANEOUS) ×3
ELECT BLADE 6.5 EXT (BLADE) ×3 IMPLANT
ELECT CAUTERY BLADE 6.4 (BLADE) ×3 IMPLANT
ELECT REM PT RETURN 9FT ADLT (ELECTROSURGICAL) ×6
ELECTRODE BLDE 4.0 EZ CLN MEGD (MISCELLANEOUS) ×2 IMPLANT
ELECTRODE REM PT RTRN 9FT ADLT (ELECTROSURGICAL) ×4 IMPLANT
FELT TEFLON 1X6 (MISCELLANEOUS) ×6 IMPLANT
GAUZE 4X4 16PLY ~~LOC~~+RFID DBL (SPONGE) ×3 IMPLANT
GAUZE SPONGE 4X4 12PLY STRL (GAUZE/BANDAGES/DRESSINGS) ×6 IMPLANT
GLOVE BIO SURGEON STRL SZ 6 (GLOVE) ×6 IMPLANT
GLOVE BIO SURGEON STRL SZ 6.5 (GLOVE) ×3 IMPLANT
GLOVE BIO SURGEON STRL SZ7.5 (GLOVE) ×10 IMPLANT
GLOVE BIOGEL PI IND STRL 6.5 (GLOVE) IMPLANT
GLOVE BIOGEL PI INDICATOR 6.5 (GLOVE) ×1
GOWN STRL REUS W/ TWL LRG LVL3 (GOWN DISPOSABLE) ×8 IMPLANT
GOWN STRL REUS W/TWL LRG LVL3 (GOWN DISPOSABLE) ×8
HEMOSTAT POWDER SURGIFOAM 1G (HEMOSTASIS) ×9 IMPLANT
HEMOSTAT SURGICEL 2X14 (HEMOSTASIS) ×4 IMPLANT
INSERT FOGARTY XLG (MISCELLANEOUS) IMPLANT
KIT BASIN OR (CUSTOM PROCEDURE TRAY) ×3 IMPLANT
KIT SUCTION CATH 14FR (SUCTIONS) ×3 IMPLANT
KIT TURNOVER KIT B (KITS) ×3 IMPLANT
KIT VASOVIEW HEMOPRO 2 VH 4000 (KITS) ×3 IMPLANT
LEAD PACING MYOCARDI (MISCELLANEOUS) ×3 IMPLANT
MARKER GRAFT CORONARY BYPASS (MISCELLANEOUS) ×9 IMPLANT
NS IRRIG 1000ML POUR BTL (IV SOLUTION) ×15 IMPLANT
PACK E OPEN HEART (SUTURE) ×3 IMPLANT
PACK OPEN HEART (CUSTOM PROCEDURE TRAY) ×3 IMPLANT
PAD ARMBOARD 7.5X6 YLW CONV (MISCELLANEOUS) ×6 IMPLANT
PAD ELECT DEFIB RADIOL ZOLL (MISCELLANEOUS) ×3 IMPLANT
PENCIL BUTTON HOLSTER BLD 10FT (ELECTRODE) ×3 IMPLANT
POSITIONER HEAD DONUT 9IN (MISCELLANEOUS) ×3 IMPLANT
POWDER SURGICEL 3.0 GRAM (HEMOSTASIS) ×3 IMPLANT
PUNCH AORTIC ROTATE 4.0MM (MISCELLANEOUS) IMPLANT
PUNCH AORTIC ROTATE 4.5MM 8IN (MISCELLANEOUS) ×1 IMPLANT
PUNCH AORTIC ROTATE 5MM 8IN (MISCELLANEOUS) IMPLANT
SET MPS 3-ND DEL (MISCELLANEOUS) ×1 IMPLANT
SPONGE T-LAP 18X18 ~~LOC~~+RFID (SPONGE) ×12 IMPLANT
SPONGE T-LAP 4X18 ~~LOC~~+RFID (SPONGE) ×3 IMPLANT
SUPPORT HEART JANKE-BARRON (MISCELLANEOUS) ×3 IMPLANT
SURGIFLO W/THROMBIN 8M KIT (HEMOSTASIS) ×3 IMPLANT
SUT BONE WAX W31G (SUTURE) ×3 IMPLANT
SUT MNCRL AB 4-0 PS2 18 (SUTURE) IMPLANT
SUT PROLENE 3 0 SH DA (SUTURE) IMPLANT
SUT PROLENE 3 0 SH1 36 (SUTURE) IMPLANT
SUT PROLENE 4 0 RB 1 (SUTURE) ×3
SUT PROLENE 4 0 SH DA (SUTURE) ×3 IMPLANT
SUT PROLENE 4-0 RB1 .5 CRCL 36 (SUTURE) ×2 IMPLANT
SUT PROLENE 5 0 C 1 36 (SUTURE) IMPLANT
SUT PROLENE 6 0 C 1 30 (SUTURE) IMPLANT
SUT PROLENE 6 0 CC (SUTURE) ×9 IMPLANT
SUT PROLENE 8 0 BV175 6 (SUTURE) ×2 IMPLANT
SUT PROLENE BLUE 7 0 (SUTURE) ×3 IMPLANT
SUT PROLENE POLY MONO (SUTURE) ×4 IMPLANT
SUT SILK  1 MH (SUTURE)
SUT SILK 1 MH (SUTURE) IMPLANT
SUT SILK 2 0 SH CR/8 (SUTURE) IMPLANT
SUT SILK 3 0 SH CR/8 (SUTURE) IMPLANT
SUT STEEL 6MS V (SUTURE) ×7 IMPLANT
SUT STEEL SZ 6 DBL 3X14 BALL (SUTURE) ×3 IMPLANT
SUT VIC AB 1 CTX 36 (SUTURE) ×6
SUT VIC AB 1 CTX36XBRD ANBCTR (SUTURE) ×4 IMPLANT
SUT VIC AB 2-0 CT1 27 (SUTURE) ×3
SUT VIC AB 2-0 CT1 TAPERPNT 27 (SUTURE) IMPLANT
SUT VIC AB 2-0 CTX 27 (SUTURE) IMPLANT
SUT VIC AB 3-0 X1 27 (SUTURE) ×1 IMPLANT
SYSTEM SAHARA CHEST DRAIN ATS (WOUND CARE) ×3 IMPLANT
TOWEL GREEN STERILE (TOWEL DISPOSABLE) ×3 IMPLANT
TOWEL GREEN STERILE FF (TOWEL DISPOSABLE) ×3 IMPLANT
TRAY FOLEY SLVR 16FR TEMP STAT (SET/KITS/TRAYS/PACK) ×3 IMPLANT
TUBING LAP HI FLOW INSUFFLATIO (TUBING) ×3 IMPLANT
UNDERPAD 30X36 HEAVY ABSORB (UNDERPADS AND DIAPERS) ×3 IMPLANT
WATER STERILE IRR 1000ML POUR (IV SOLUTION) ×6 IMPLANT

## 2022-02-14 NOTE — Procedures (Signed)
Extubation Procedure Note ? ?Patient Details:   ?Name: [name] ?DOB: 14-Dec-1960 ?MRN: [id number] ?  ?Airway Documentation:  ?  ?Vent end date: 02/14/22 Vent end time: 1955  ? ?Evaluation ? O2 sats: stable throughout ?Complications: No apparent complications ?Patient did tolerate procedure well. ?Bilateral Breath Sounds: Clear ?  ?Yes ?NIF-25 ?FVC-1.56L ? ?Keysha, [place] ?02/14/2022, 8:13 PM ? ?

## 2022-02-14 NOTE — Transfer of Care (Signed)
Immediate Anesthesia Transfer of Care Note ? ?Patient: Caralina Nop ? ?Procedure(s) Performed: CORONARY ARTERY BYPASS GRAFTING (CABG) X THREE BYPASSES. USING LEFT INTERNAL MAMMARY ARTERY & RIGHT GREATER SAPHENOUS VEIN HARVESTED ENDOSCOPICALLY. (Chest) ?TRANSESOPHAGEAL ECHOCARDIOGRAM (TEE) ? ?Patient Location: SICU ? ?Anesthesia Type:General ? ?Level of Consciousness: Patient remains intubated per anesthesia plan ? ?Airway & Oxygen Therapy: Patient remains intubated per anesthesia plan and Patient placed on Ventilator (see vital sign flow sheet for setting) ? ?Post-op Assessment: Report given to RN and Post -op Vital signs reviewed and stable ? ?Post vital signs: Reviewed and stable ? ?Last Vitals:  ?Vitals Value Taken Time  ?BP 101/57   ?Temp    ?Pulse 80 02/14/22 1403  ?Resp 18   ?SpO2 100 % 02/14/22 1403  ?Vitals shown include unvalidated device data. ? ?Last Pain:  ?Vitals:  ? 02/14/22 0651  ?TempSrc:   ?PainSc: 0-No pain  ?   ? ?  ? ?Complications: No notable events documented. ?

## 2022-02-14 NOTE — Hospital Course (Addendum)
?History of Present Illness:    ?  ?[name] is a 61 year old female 40-pack-year former smoker having quit in 2011 with a past medical history significant for dyslipidemia, COPD, hypertension, borderline diabetes, and gastroesophageal reflux disease.  She has a positive family history for premature coronary artery disease in both her father and mother.  She began having some chest discomfort back in February of this year and underwent a stress test showing no evidence of reversible ischemia.  An echocardiogram done at the same time showed LV function of 60 to 65% and mild aortic stenosis.  Since those studies were done in February, she has continued to have intermittent exertional chest discomfort.  She has not been under the care of a cardiologist but was scheduled to see Dr. Dulce Sellar in [place] on 02/23/2022.  ? Two days ago, she had an episode of chest pressure radiating to her left shoulder and left arm while she was driving.  This was associated with diaphoresis and nausea.  She presented to [facility] and was admitted for evaluation.  EKG showed nonspecific ST upsloping in the inferior and lateral leads.  Troponin was reportedly negative.  After evaluation by a cardiologist, transfer to [facility] for further evaluation with left heart catheterization was recommended. ?Left heart cath was performed and demonstrated significant left main and multivessel coronary artery disease.  There is a 50% stenosis in the distal left main coronary.  There is a 40% mid LAD stenosis.  The circumflex has a 70% stenosis in between the origins of the first and second obtuse marginals.  The right coronary artery has a 75% mid stenosis. ?A left ventriculogram was not performed.  The LVEDP was 11 mmHg. ?CT surgery has been asked to evaluate [name] for consideration of coronary bypass grafting. ?  ?[name] is left handed. She has a full plate denture on top and has several teeth in her lower jaw that  are bothersome.  ? ?Dr. Maren Beach evaluated the patient and all relevant studies and recommended proceeding with CABG. ? ?Hospital course:  ? ?The patient was admitted electively on 02/14/2022 and taken to the operating room at which time she underwent CABG x3.  She tolerated the procedure well was taken to the surgical intensive care unit in stable condition.  The patient was extubated the evening of surgery.  The patient had gastric dilatation on KUB post operatively.  She was started on reglan.  The patient is bradycardic and her Lopressor was discontinued.  The patient underwent debridement of chest tubes and arterial lines.  She had issues with pain control due to allergies to narcotics.  She was treated with Toradol with good response.  She was stable for transfer to the progressive care unit on 02/16/2022.  The patient's heart rate improved with rates in the 70s.  She remained in NSR.  Her pacing wires were removed without difficulty.  The patient was treated with lasix for mild edema and she responded well.  She was started on low dose BB prior to discharge.  She did show increasing blood pressure trends and her home antihypertensive medication regimen was resumed.  She has a significant history of emphysema with a 40+ pack year cigarette history.  She did quit in 2011.  It was thought that she might require home oxygen but she was eventually able to be weaned off with acceptable parameters.  She is ambulating independently.  Her surgical incisions are healing without evidence of infection.  She  is medically stable for discharge home today. ?

## 2022-02-14 NOTE — Progress Notes (Signed)
EVENING ROUNDS NOTE : ? ?   ?[address] ?      [place] [address] ?            [phone]   ?              ?Day of Surgery ?Procedure(s) (LRB): ?CORONARY ARTERY BYPASS GRAFTING (CABG) X THREE BYPASSES. USING LEFT INTERNAL MAMMARY ARTERY & RIGHT GREATER SAPHENOUS VEIN HARVESTED ENDOSCOPICALLY. (N/A) ?TRANSESOPHAGEAL ECHOCARDIOGRAM (TEE) (N/A) ? ? ?Total Length of Stay:  LOS: 0 days  ?Events:   ?Weaning to extubate ?Low dose neo ?Good hemodynamics ?Low CT output ? ? ? ?BP (!) 132/102   Pulse 80   Temp 97.9 ?F (36.6 ?C)   Resp 17   Ht 5\' 4"  (1.626 m)   Wt 85.3 kg   SpO2 100%   BMI 32.27 kg/m?  ? ?PAP: (16-29)/(5-12) 22/12 ?CO:  [3.4 L/min-3.9 L/min] 3.4 L/min ?CI:  [1.8 L/min/m2-2 L/min/m2] 1.8 L/min/m2 ? ?Vent Mode: SIMV;PRVC;PSV ?FiO2 (%):  [50 %] 50 % ?Set Rate:  [12 bmp-16 bmp] 16 bmp ?Vt Set:  [540 mL-600 mL] 600 mL ?PEEP:  [5 cmH20] 5 cmH20 ?Pressure Support:  [10 cmH20] 10 cmH20 ?Plateau Pressure:  [16 cmH20] 16 cmH20 ? ? sodium chloride Stopped (02/14/22 1636)  ? [START ON 02/15/2022] sodium chloride    ? sodium chloride 20 mL/hr at 02/14/22 1417  ? albumin human 12.5 g (02/14/22 1410)  ?  ceFAZolin (ANCEF) IV    ? dexmedetomidine (PRECEDEX) IV infusion 0.7 mcg/kg/hr (02/14/22 1700)  ? famotidine (PEPCID) IV Stopped (02/14/22 1453)  ? insulin 1.2 Units/hr (02/14/22 1700)  ? lactated ringers    ? lactated ringers    ? lactated ringers 20 mL/hr at 02/14/22 1700  ? magnesium sulfate 20 mL/hr at 02/14/22 1700  ? nitroGLYCERIN Stopped (02/14/22 1502)  ? phenylephrine (NEO-SYNEPHRINE) Adult infusion 6 mcg/min (02/14/22 1700)  ? potassium chloride 10 mEq (02/14/22 1743)  ? vancomycin    ? ? ?No intake/output data recorded. ? ? ? ?  Latest Ref Rng & Units 02/14/2022  ?  2:20 PM 02/14/2022  ? 12:45 PM 02/14/2022  ? 12:41 PM  ?CBC  ?WBC 4.0 - 10.5 K/uL 10.8      ?Hemoglobin 12.0 - 15.0 g/dL 02/16/2022   9.2   8.8    ?Hematocrit 36.0 - 46.0 % 30.0   27.0   26.0    ?Platelets 150 - 400 K/uL 151       ? ? ? ?  Latest Ref Rng & Units 02/14/2022  ? 12:45 PM 02/14/2022  ? 12:41 PM 02/14/2022  ? 12:08 PM  ?BMP  ?Glucose 70 - 99 mg/dL  02/16/2022     ?BUN 6 - 20 mg/dL  13     ?Creatinine 0.44 - 1.00 mg/dL  449     ?Sodium 135 - 145 mmol/L 142   141   140    ?Potassium 3.5 - 5.1 mmol/L 3.8   3.7   3.9    ?Chloride 98 - 111 mmol/L  104     ? ? ?ABG ?   ?Component Value Date/Time  ? PHART 7.322 (L) 02/14/2022 1245  ? PCO2ART 45.6 02/14/2022 1245  ? PO2ART 174 (H) 02/14/2022 1245  ? HCO3 23.6 02/14/2022 1245  ? TCO2 25 02/14/2022 1245  ? ACIDBASEDEF 2.0 02/14/2022 1245  ? O2SAT 99 02/14/2022 1245  ? ? ? ? ? ?02/16/2022, MD ?02/14/2022 5:48 PM ? ? ?

## 2022-02-14 NOTE — Op Note (Signed)
[name], [name] ?MEDICAL RECORD NO: [id number] ?ACCOUNT NO: [id number] ?DATE OF BIRTH: [birth year] ?FACILITY: [facility] ?LOCATION: [facility] ?PHYSICIAN: Kendra Childs, MD ? ?Operative Report  ? ?DATE OF PROCEDURE: 02/14/2022 ? ?OPERATION:    ?1.  Coronary artery bypass grafting x3 (left internal mammary artery to LAD, saphenous vein graft to posterior descending, saphenous vein graft to circumflex marginal). ?2.  Endoscopic harvest of right leg greater saphenous vein. ? ?PREOPERATIVE DIAGNOSIS:  Significant left main and multivessel coronary artery disease with unstable angina. ? ?POSTOPERATIVE DIAGNOSIS:  Significant left main and multivessel coronary artery disease with unstable angina. ? ?SURGEON:  Kendra Childs, MD ? ?ASSISTANT:  Kendra Pierini PA-C ? ?A first assistant was needed to perform this complex cardiac procedure and to maintain standard of care for cardiac surgery at this hospital.  First assistant was needed to endoscopically harvest the saphenous vein for conduit, close the leg incisions,  ?assist with the distal anastomoses with suture management, exposure, suctioning and general assistance. ? ?ANESTHESIA:  General. ? ?CLINICAL NOTE:  The patient is a 61 year old overweight female who has been having increased symptoms of chest, epigastric and more recently left shoulder discomfort.  Although a stress test was inconclusive, she was recommended for cardiac  ?catheterization, which demonstrated left main stenosis as well as disease of the LAD, circumflex and RCA.  She was recommended for surgical coronary revascularization.  After reviewing her coronary angiograms, I met with the patient in consultation and  ?agreed with the recommendation for CABG as her best long-term therapy to help relieve symptoms of angina, preserved LV function, and to prevent further cardiac events.  I discussed the procedure of CABG in detail with the patient and her husband  ?including the use of general anesthesia and  cardiopulmonary bypass, the location of the surgical incisions, the use of saphenous vein and internal mammary artery as conduit, and the expected postoperative hospital recovery.  I reviewed with her the risks ? to her of the surgery including the risks of stroke, bleeding, blood transfusion requirement, infection, organ failure, MI, arrhythmia, death.  She demonstrated her understanding and agreed to proceed with surgery under what I felt was an informed  ?consent. ? ?DESCRIPTION OF PROCEDURE:  The patient was brought from preoperative holding where informed consent was documented and final issues were addressed with the patient and questions answered in a face-to-face encounter. ? ?The patient was placed supine on the operating table and general anesthesia was induced.  She remained stable.  A transesophageal echo probe was placed by the anesthesia team.  The patient was prepped and draped as a sterile field.  A sternal incision  ?was made after a proper timeout was performed.  A leg incision was made and the greater saphenous vein on the right leg was harvested using endoscopic technique. ? ?The left internal mammary artery was harvested as a pedicle graft from its origin at the subclavian vessels.  It was a good vessel, 1.5 mm in diameter with excellent flow.  The sternal retractor was placed using the deep blades because of the patient's  ?obese body habitus.  The pericardium was suspended.  Pursestrings were placed in the ascending aorta and right atrium.  Heparin was administered.  When the ACT was documented as being therapeutic, the patient was cannulated and placed on cardiopulmonary  ?bypass.  The coronaries were identified for grafting and the mammary artery and vein grafts were prepared for the distal anastomosis.  Cardioplegia cannulas were placed both antegrade  and retrograde cold blood cardioplegia and the patient was cooled to  ?32 degrees.  The aortic crossclamp was applied.  One liter of cold  blood cardioplegia was delivered in split doses between the antegrade aortic and retrograde coronary sinus catheters.  There was good cardioplegic arrest and supple temperature dropped  ?less than 12 degrees.  Cardioplegia was delivered every 20 minutes while the crossclamp was in place. ? ?The distal coronary anastomoses were performed.  The first distal anastomosis was to the posterior descending.  This was a 1.5 mm vessel.  There was a proximal 75% stenosis in the main RCA.  A reverse saphenous vein was sewn end-to-side with running 7-0  ?Prolene with good flow through the graft.  Cardioplegia was redosed. ? ?The second distal anastomosis was then placed through the circumflex marginal branch of the left coronary.  This had a proximal 70% stenosis.  A reverse saphenous vein was sewn end-to-side with running 7-0 Prolene with good flow through the graft.   ?Cardioplegia was redosed. ? ?The third distal anastomosis was the distal third to the LAD.  There was a proximal 75% to 80% stenosis.  The left IMA pedicle was brought through an opening in the left lateral pericardium, was brought down onto the LAD and sewn end-to-side with running ? 8-0 Prolene.  There was good flow through the anastomosis after briefly releasing the pedicle bulldog and the mammary artery.  The bulldog was reapplied and the pedicle secured to epicardium with 6-0 Prolenes.  Cardioplegia was redosed. ? ?While the crossclamp was still in place, 2 proximal vein anastomoses were performed using a 4.5 mm punch and running 6-0 Prolene.  Prior to tying down the final proximal anastomosis, air was vented from the coronaries with a dose of retrograde warm blood ? cardioplegia and the usual de-airing maneuvers.  The crossclamp was then removed. ? ?The vein grafts were de-aired and opened and each had good flow and hemostasis was documented at the proximal and distal sites.  The patient was rewarmed and reperfused. The heart resumed a spontaneous rhythm.   The cardioplegia cannulas were removed.   ?Temporary pacing wires were applied.  The lungs were expanded and the ventilator was resumed. ? ?The patient was then weaned uneventfully from cardiopulmonary bypass.  She did not require inotropic support.  Echo showed preserved good LV function.  Protamine was administered without adverse reaction.  The cannulas were removed.  The mediastinum was  ?irrigated.  The superior pericardial fat was closed over the aorta and vein grafts.  Anterior mediastinal and left pleural chest tubes were placed and brought out through separate incisions.  The sternum was closed with wire.  The patient remained  ?stable.  The pectoralis fascia was closed with a running #1 Vicryl.  The subcutaneous and skin layers were closed with running Vicryl and sterile dressings were applied.  Total cardiopulmonary bypass time was 110 minutes. ? ? ?VAI ?D: 02/14/2022 5:47:41 pm T: 02/14/2022 11:27:00 pm  ?JOB: [id number]/ [id number]  ?

## 2022-02-14 NOTE — Anesthesia Procedure Notes (Signed)
Central Venous Catheter Insertion ?Performed by: Achille Rich, MD, anesthesiologist ?Start/End4/18/2023 7:10 AM, 02/14/2022 7:22 AM ?Patient location: Pre-op. ?Preanesthetic checklist: patient identified, IV checked, site marked, risks and benefits discussed, surgical consent, monitors and equipment checked, pre-op evaluation, timeout performed and anesthesia consent ?Lidocaine 1% used for infiltration and patient sedated ?Hand hygiene performed  and maximum sterile barriers used  ?Catheter size: 8.5 Fr ?Sheath introducer ?Procedure performed using ultrasound guided technique. ?Ultrasound Notes:anatomy identified, needle tip was noted to be adjacent to the nerve/plexus identified, no ultrasound evidence of intravascular and/or intraneural injection and image(s) printed for medical record ?Attempts: 1 ?Following insertion, line sutured and dressing applied. ?Post procedure assessment: blood return through all ports, free fluid flow and no air ? ?Patient tolerated the procedure well with no immediate complications. ? ? ? ? ?

## 2022-02-14 NOTE — Progress Notes (Signed)
Pre Procedure note for inpatients: ?  ?[facility] Takeyia Forcum has been scheduled for Procedure(s): ?CORONARY ARTERY BYPASS GRAFTING (CABG) X BYPASSES. USING LEFT INTERNAL MAMMARY ARTERY & RIGHT GREATER SAPHENOUS VEIN HARVESTED ENDOSCOPICALLY. (N/A) ?TRANSESOPHAGEAL ECHOCARDIOGRAM (TEE) (N/A) today. The various methods of treatment have been discussed with the patient. After consideration of the risks, benefits and treatment options the patient has consented to the planned procedure.  ? ?The patient has been seen and labs reviewed. There are no changes in the patient?s condition to prevent proceeding with the planned procedure today. ? ?Recent labs: ? ?Lab Results  ?Component Value Date  ? WBC 6.9 02/09/2022  ? HGB 14.9 02/09/2022  ? HCT 42.2 02/09/2022  ? PLT 262 02/09/2022  ? GLUCOSE 119 (H) 02/09/2022  ? ALT 34 02/09/2022  ? AST 28 02/09/2022  ? NA 139 02/09/2022  ? K 3.6 02/09/2022  ? CL 106 02/09/2022  ? CREATININE 0.66 02/09/2022  ? BUN 16 02/09/2022  ? CO2 26 02/09/2022  ? TSH 2.619 02/09/2022  ? INR 1.0 02/09/2022  ? HGBA1C 5.5 02/09/2022  ? ? ?Dahlia Byes, MD ?02/14/2022 7:50 AM ? ? ?   ?

## 2022-02-14 NOTE — Anesthesia Procedure Notes (Signed)
Arterial Line Insertion ?Start/End4/18/2023 7:00 AM, 02/14/2022 7:10 AM ?Performed by: Waynard Edwards, CRNA, CRNA ? Patient location: Pre-op. ?Preanesthetic checklist: patient identified, IV checked, site marked, risks and benefits discussed, surgical consent, monitors and equipment checked, pre-op evaluation, timeout performed and anesthesia consent ?Lidocaine 1% used for infiltration ?Right, radial was placed ?Catheter size: 20 G ?Hand hygiene performed  and maximum sterile barriers used  ? ?Attempts: 2 ?Procedure performed without using ultrasound guided technique. ?Following insertion, Biopatch and dressing applied. ?Post procedure assessment: normal ? ?Patient tolerated the procedure well with no immediate complications. ? ? ?

## 2022-02-14 NOTE — H&P (Signed)
Date of Service:  02/08/2022  4:01 PM ? Related encounter: Admission (Discharged) from 02/08/2022 in [facility] ?  ?Expand All Collapse All ?Show:Clear all ?[x] Written[x] Templated[x] Copied ? ?Added by: ?[x] Roddenberry, , PA-C[x] , MD ? ?[] Hover for details ?   ?   ?   ?   ?   ?   ?   ?   ?   ?   ?   ?   ?   ?   ?   ?   ?   ?   ?   ?   ?   ?   ?   ?   ?   ?   ?   ?   ?   ?   ?   ?   ?   ?   ?   ?   ?   ?   ?   ?   ?   ?   ?   ?   ?   ?   ?   ?   ?   ?   ?   ?   ?   ?   ?   ?   ?   ?   ?   ?   ?   ?   ?   ?   ?   ?   ?   ?   ?   ?   ?   ?   ?   ?   ?   ?   ?   ?   ?   ?   ?   ?   ?   ?   ?   ?   ?   ?   ?   ?   ?   ?   ?   ?  ?   ?[address] ?      [place] ?            [phone]                                      ?  ?[name] ?[facility] Medical Record [phone] ?Date of Birth: 08-Sep-1961 ?  ?Referring: [facility], MD ?Primary Care: #[id number], NP ?Primary Cardiologist: 12/28/1961, MD ?  ?Chief Complaint:   Chest pain ?  ?  ?History of Present Illness:    ?  ?[name] is a 61 year old female 40-pack-year former smoker having quit in 2011 with a past medical history significant for dyslipidemia, COPD, hypertension, borderline diabetes, and gastroesophageal reflux disease.  She has a positive family history for premature coronary artery disease in both her father and mother.  She began having some chest discomfort back in February of this year and underwent a stress test showing no evidence of reversible ischemia.  An echocardiogram done at the same time showed LV function of 60 to 65% and mild aortic stenosis.  Since those studies were done in February, she has continued to have intermittent exertional chest discomfort.  She has not been under the care of a cardiologist but was scheduled to see Dr. 2012 in [place] on 02/23/2022.  ? Two days ago, she had an episode of chest pressure radiating to  her left shoulder and left arm while she was driving.  This was associated with diaphoresis and nausea.  [facility] and [facility] for evaluation.  EKG showed nonspecific ST upsloping in the inferior and lateral leads.  Troponin was reportedly negative.  After evaluation by a cardiologist, [facility] with left heart catheterization was recommended. ?Left heart cath was performed earlier today demonstrated significant left main and multivessel coronary artery disease.  There is a 50% stenosis in the distal left main coronary.  There is a 40% mid LAD stenosis.  The circumflex has a 70% stenosis in between the origins of the first and second obtuse marginals.  The right coronary artery has a 75% mid stenosis. ?A left ventriculogram was not performed.  The LVEDP was 11 mmHg. ?CT surgery has been asked to evaluate [name] for consideration of coronary bypass grafting. ?  ?[name] is left handed. She has a full plate denture on top and has several teeth in her lower jaw that are bothersome.  ?  ?  ?Current Activity/ Functional Status: ?Patient is independent with mobility/ambulation, transfers, ADL's, IADL's. ?  ?Zubrod Score: ?At the time of surgery this patient?s most appropriate activity status/level should be described as: ?[]     0    Normal activity, no symptoms ?[x]     1    Restricted in physical strenuous activity but ambulatory, able to do out light work ?[]     2    Ambulatory and capable of self care, unable to do work activities, up and about                 more than 50%  Of the time                            ?[]     3    Only limited self care, in bed greater than 50% of waking hours ?[]     4    Completely disabled, no self care, confined to bed or chair ?[]     5    Moribund ?  ?    ?Past Medical History:  ?Diagnosis Date  ? Adrenal nodule (HCC)    ? Aortic heart murmur on examination    ? Chest pressure 11/29/2015  ? Dyspnea on exertion  11/29/2015  ? Essential hypertension 10/14/2015  ? Family history of coronary artery disease 11/29/2015  ? Fatigue 10/14/2015  ? Frequent UTI 10/14/2015  ? Generalized anxiety disorder    ? Insomnia, idiopathic    ? Left lower quadrant pain 10/14/2015  ? Mixed hyperlipidemia    ? Panlobular emphysema (HCC) 11/29/2015  ? 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 Restless leg syndrome    ? Snoring    ?  ?  ?     ?Past Surgical History:  ?Procedure Laterality Date  ? ABDOMINAL HYSTERECTOMY      ? CHOLECYSTECTOMY      ?  ?  ?Social History  ?  ?   ?Tobacco Use  ?Smoking Status Not on file  ?Smokeless Tobacco Not on file  ?  ?Social History  ?  ?   ?Substance and Sexual Activity  ?Alcohol Use Never  ?  ?  ?  ?     ?Allergies  ?Allergen Reactions  ? Codeine Anaphylaxis  ?    Locked up ?   ? Bismuth Subsalicylate Nausea Only  ?  ?  ?         ?  Current Facility-Administered Medications  ?Medication Dose Route Frequency Provider Last Rate Last Admin  ? 0.9 %  sodium chloride infusion   Intravenous Continuous Orbie Pyo, MD      ? 0.9 %  sodium chloride infusion  250 mL Intravenous PRN Orbie Pyo, MD      ? acetaminophen (TYLENOL) tablet 650 mg  650 mg Oral Q4H PRN Orbie Pyo, MD      ? aspirin chewable tablet 81 mg  81 mg Oral Daily Orbie Pyo, MD      ? atorvastatin (LIPITOR) tablet 80 mg  80 mg Oral Daily Orbie Pyo, MD      ? hydrALAZINE (APRESOLINE) injection 10 mg  10 mg Intravenous Q20 Min PRN Orbie Pyo, MD      ? labetalol (NORMODYNE) injection 10 mg  10 mg Intravenous Q10 min PRN Orbie Pyo, MD      ? ondansetron [facility]) injection 4 mg  4 mg Intravenous Q6H PRN Orbie Pyo, MD      ? sodium chloride flush (NS) 0.9 % injection 3 mL  3 mL Intravenous Q12H Orbie Pyo, MD      ? sodium chloride flush (NS) 0.9 % injection 3 mL  3 mL Intravenous PRN Orbie Pyo, MD      ?  ?  ?       ?Medications Prior to Admission  ?Medication Sig Dispense  Refill Last Dose  ? aspirin EC 81 MG tablet Take 81 mg by mouth daily. Swallow whole.     02/07/2022  ? atorvastatin (LIPITOR) 40 MG tablet Take 40 mg by mouth daily.     02/07/2022  ? lisinopril-hydrochlorothiazide (ZESTORETIC) 20-25 MG tablet Take 1 tablet by mouth daily.     02/07/2022  ? Multiple Vitamin (MULTIVITAMIN WITH MINERALS) TABS tablet Take 1 tablet by mouth daily.     02/07/2022  ? Omega-3 Fatty Acids (FISH OIL) 1000 MG CAPS Take 1 capsule by mouth daily.     02/07/2022  ? traZODone (DESYREL) 100 MG tablet Take 100 mg by mouth at bedtime.     02/07/2022  ?  ?  ?     ?Family History  ?Problem Relation Age of Onset  ? Diabetes type II Mother    ? Hypertension Mother    ? Hyperlipidemia Mother    ? Heart attack Mother    ? Liver cancer Father    ? Hypertension Father    ? Heart attack Father    ? Diabetes Brother    ? Hypertension Brother    ? Stroke Brother    ? Diabetes Brother    ?  ?  ?  ?Review of Systems:  ?  ?  ?            Cardiac Review of Systems: Y or  [    ]= no ?            Chest Pain [ x   ]         Resting SOB [   ]         Exertional SOB  [  ]     Orthopnea [  ] ?            Pedal Edema [   ]        Palpitations [  ]            Syncope  [  ]  Presyncope [   ] ?            General Review of Systems: [Y] = yes [  ]=no ?Constitional: recent weight change [  ]; anorexia [  ]; fatigue [  ]; nausea [  ]; night sweats [  ]; fever [  ]; or chills [  ]                                                               ?            Eye : blurred vision [  ]; diplopia [   ]; vision changes [  ];  Amaurosis fugax[  ]; ?Resp: cough [  ];  wheezing[  ];  hemoptysis[  ]; shortness of breath[  ]; paroxysmal nocturnal dyspnea[  ]; dyspnea on exertion[  ]; or orthopnea[  ];  ?GI:  gallstones[  ], vomiting[  ];  dysphagia[  ]; melena[  ];  hematochezia [  ]; heartburn[  ];   Hx of  Colonoscopy[  ]; ?GU: kidney stones [  ]; hematuria[  ];   dysuria [  ];  nocturia[  ];  history of     obstruction [  ]; urinary  frequency [  ] ?            Skin: rash, swelling[  ];, hair loss[  ];  peripheral edema[  ];  or itching[  ]; ?Musculosketetal: myalgias[  ];  joint swelling[  ];  joint erythema[  ]; ? joint pain[  ];  back pa

## 2022-02-14 NOTE — Anesthesia Preprocedure Evaluation (Signed)
Anesthesia Evaluation  ?Patient identified by MRN, date of birth, ID band ?Patient awake ? ? ? ?Reviewed: ?Allergy & Precautions, H&P , NPO status , Patient's Chart, lab work & pertinent test results ? ?Airway ?Mallampati: II ? ? ?Neck ROM: full ? ? ? Dental ?  ?Pulmonary ?COPD, former smoker,  ?  ?breath sounds clear to auscultation ? ? ? ? ? ? Cardiovascular ?hypertension, + CAD  ? ?Rhythm:regular Rate:Normal ? ?EF 70%. Normal LV function. ? ?Multivessel CAD by cath. ?  ?Neuro/Psych ?PSYCHIATRIC DISORDERS Anxiety Depression   ? GI/Hepatic ?  ?Endo/Other  ? ? Renal/GU ?  ? ?  ?Musculoskeletal ? ? Abdominal ?  ?Peds ? Hematology ?  ?Anesthesia Other Findings ? ? Reproductive/Obstetrics ? ?  ? ? ? ? ? ? ? ? ? ? ? ? ? ?  ?  ? ? ? ? ? ? ? ? ?Anesthesia Physical ?Anesthesia Plan ? ?ASA: 3 ? ?Anesthesia Plan: General  ? ?Post-op Pain Management:   ? ?Induction: Intravenous ? ?PONV Risk Score and Plan: 3 and Ondansetron, Dexamethasone, Midazolam and Treatment may vary due to age or medical condition ? ?Airway Management Planned: Oral ETT ? ?Additional Equipment: Arterial line, CVP, PA Cath, TEE and Ultrasound Guidance Line Placement ? ?Intra-op Plan:  ? ?Post-operative Plan: Extubation in OR ? ?Informed Consent: I have reviewed the patients History and Physical, chart, labs and discussed the procedure including the risks, benefits and alternatives for the proposed anesthesia with the patient or authorized representative who has indicated his/her understanding and acceptance.  ? ? ? ?Dental advisory given ? ?Plan Discussed with: CRNA, Anesthesiologist and Surgeon ? ?Anesthesia Plan Comments:   ? ? ? ? ? ? ?Anesthesia Quick Evaluation ? ?

## 2022-02-14 NOTE — Brief Op Note (Addendum)
02/14/2022 ? ?8:52 AM ? ?PATIENT:  [name]  61 y.o. female ? ?PRE-OPERATIVE DIAGNOSIS:  CORONARY ARTERY DISEASE  ? ?POST-OPERATIVE DIAGNOSIS:  CORONARY ARTERY DISEASE  ? ?PROCEDURE:  Procedure(s): ?CORONARY ARTERY BYPASS GRAFTING (CABG) X THREE BYPASSES. USING LEFT INTERNAL MAMMARY ARTERY & RIGHT GREATER SAPHENOUS VEIN HARVESTED ENDOSCOPICALLY. (N/A) ?TRANSESOPHAGEAL ECHOCARDIOGRAM (TEE) (N/A) ?Vein harvest time: Vein prep time: ?LIMA-LAD ?SVG-OM ?SVG-PD ? ?SURGEON:  Surgeon(s) and Role: ?   [place], MD - Primary ? ?PHYSICIAN ASSISTANT: WAYNE GOLD PA-C ? ?ASSISTANTS: STAFF  ? ?ANESTHESIA:   general ? ?EBL:  429 mL  ? ?BLOOD ADMINISTERED:none ? ?DRAINS:  LEFT PLEURAL AND  anterior MEDIASTINAL CHEST TUBES   ? ?LOCAL MEDICATIONS USED:  NONE ? ?SPECIMEN:  No Specimen ? ?DISPOSITION OF SPECIMEN:  N/A ? ?COUNTS:  YES ? ?TOURNIQUET:  * No tourniquets in log * ? ?DICTATION: .Other Dictation: Dictation Number PENDING ? ?PLAN OF CARE: Admit to inpatient  ? ?PATIENT DISPOSITION:  ICU - intubated and hemodynamically stable. ?  ?Delay start of Pharmacological VTE agent (>24hrs) due to surgical blood loss or risk of bleeding: yes ? ?COMPLICATIONS: NO KNOWN ? ?

## 2022-02-14 NOTE — Anesthesia Procedure Notes (Signed)
Procedure Name: Intubation ?Date/Time: 02/14/2022 8:16 AM ?Performed by: Lorie Phenix, CRNA ?Pre-anesthesia Checklist: Patient identified, Emergency Drugs available, Suction available and Patient being monitored ?Patient Re-evaluated:Patient Re-evaluated prior to induction ?Oxygen Delivery Method: Circle system utilized ?Preoxygenation: Pre-oxygenation with 100% oxygen ?Induction Type: IV induction ?Ventilation: Mask ventilation without difficulty ?Laryngoscope Size: Mac and 3 ?Grade View: Grade I ?Tube type: Oral ?Tube size: 8.0 mm ?Number of attempts: 1 ?Airway Equipment and Method: Stylet ?Placement Confirmation: ETT inserted through vocal cords under direct vision, positive ETCO2 and breath sounds checked- equal and bilateral ?Secured at: 22 cm ?Tube secured with: Tape ?Dental Injury: Teeth and Oropharynx as per pre-operative assessment  ? ? ? ? ?

## 2022-02-14 NOTE — Anesthesia Procedure Notes (Signed)
Central Venous Catheter Insertion ?Performed by: Achille Rich, MD, anesthesiologist ?Start/End4/18/2023 7:23 AM, 02/14/2022 7:27 AM ?Patient location: Pre-op. ?Preanesthetic checklist: patient identified, IV checked, site marked, risks and benefits discussed, surgical consent, monitors and equipment checked, pre-op evaluation, timeout performed and anesthesia consent ?Hand hygiene performed  and maximum sterile barriers used  ?PA cath was placed.Swan type:thermodilution ?Procedure performed without using ultrasound guided technique. ?Attempts: 1 ?Patient tolerated the procedure well with no immediate complications. ? ? ? ?

## 2022-02-15 ENCOUNTER — Encounter (HOSPITAL_COMMUNITY): Payer: Self-pay | Admitting: Cardiothoracic Surgery

## 2022-02-15 ENCOUNTER — Inpatient Hospital Stay (HOSPITAL_COMMUNITY): Payer: BC Managed Care – PPO

## 2022-02-15 LAB — PREPARE FRESH FROZEN PLASMA

## 2022-02-15 LAB — BASIC METABOLIC PANEL
Anion gap: 4 — ABNORMAL LOW (ref 5–15)
Anion gap: 5 (ref 5–15)
Anion gap: 5 (ref 5–15)
BUN: 10 mg/dL (ref 6–20)
BUN: 10 mg/dL (ref 6–20)
BUN: 11 mg/dL (ref 6–20)
CO2: 23 mmol/L (ref 22–32)
CO2: 23 mmol/L (ref 22–32)
CO2: 28 mmol/L (ref 22–32)
Calcium: 7.2 mg/dL — ABNORMAL LOW (ref 8.9–10.3)
Calcium: 7.3 mg/dL — ABNORMAL LOW (ref 8.9–10.3)
Calcium: 8.1 mg/dL — ABNORMAL LOW (ref 8.9–10.3)
Chloride: 107 mmol/L (ref 98–111)
Chloride: 111 mmol/L (ref 98–111)
Chloride: 114 mmol/L — ABNORMAL HIGH (ref 98–111)
Creatinine, Ser: 0.46 mg/dL (ref 0.44–1.00)
Creatinine, Ser: 0.46 mg/dL (ref 0.44–1.00)
Creatinine, Ser: 0.67 mg/dL (ref 0.44–1.00)
GFR, Estimated: >60 mL/min (ref >60)
GFR, Estimated: >60 mL/min (ref >60)
GFR, Estimated: >60 mL/min (ref >60)
Glucose, Bld: 110 mg/dL — ABNORMAL HIGH (ref 70–99)
Glucose, Bld: 113 mg/dL — ABNORMAL HIGH (ref 70–99)
Glucose, Bld: 124 mg/dL — ABNORMAL HIGH (ref 70–99)
Potassium: 3.5 mmol/L (ref 3.5–5.1)
Potassium: 3.8 mmol/L (ref 3.5–5.1)
Potassium: 3.9 mmol/L (ref 3.5–5.1)
Sodium: 138 mmol/L (ref 135–145)
Sodium: 140 mmol/L (ref 135–145)
Sodium: 142 mmol/L (ref 135–145)

## 2022-02-15 LAB — GLUCOSE, CAPILLARY
Glucose-Capillary: 99 mg/dL (ref 70–99)
Glucose-Capillary: 116 mg/dL — ABNORMAL HIGH (ref 70–99)
Glucose-Capillary: 108 mg/dL — ABNORMAL HIGH (ref 70–99)
Glucose-Capillary: 107 mg/dL — ABNORMAL HIGH (ref 70–99)
Glucose-Capillary: 113 mg/dL — ABNORMAL HIGH (ref 70–99)
Glucose-Capillary: 127 mg/dL — ABNORMAL HIGH (ref 70–99)
Glucose-Capillary: 98 mg/dL (ref 70–99)

## 2022-02-15 LAB — BPAM FFP
Blood Product Expiration Date: 202304222359
Blood Product Expiration Date: 202304222359
ISSUE DATE / TIME: 202304171505
ISSUE DATE / TIME: 202304171505
Unit Type and Rh: 5100
Unit Type and Rh: 9500

## 2022-02-15 LAB — CBC
HCT: 29.4 % — ABNORMAL LOW (ref 36.0–46.0)
HCT: 29.5 % — ABNORMAL LOW (ref 36.0–46.0)
Hemoglobin: 10 g/dL — ABNORMAL LOW (ref 12.0–15.0)
Hemoglobin: 10 g/dL — ABNORMAL LOW (ref 12.0–15.0)
MCH: 31.7 pg (ref 26.0–34.0)
MCH: 32.4 pg (ref 26.0–34.0)
MCHC: 34 g/dL (ref 30.0–36.0)
MCHC: 33.9 g/dL (ref 30.0–36.0)
MCV: 93.3 fL (ref 80.0–100.0)
MCV: 95.5 fL (ref 80.0–100.0)
Platelets: 194 K/uL (ref 150–400)
Platelets: 182 K/uL (ref 150–400)
RBC: 3.15 MIL/uL — ABNORMAL LOW (ref 3.87–5.11)
RBC: 3.09 MIL/uL — ABNORMAL LOW (ref 3.87–5.11)
RDW: 12.6 % (ref 11.5–15.5)
RDW: 12.9 % (ref 11.5–15.5)
WBC: 9 K/uL (ref 4.0–10.5)
WBC: 9.5 K/uL (ref 4.0–10.5)
nRBC: 0 % (ref 0.0–0.2)
nRBC: 0 % (ref 0.0–0.2)

## 2022-02-15 LAB — MAGNESIUM
Magnesium: 2.2 mg/dL (ref 1.7–2.4)
Magnesium: 2.4 mg/dL (ref 1.7–2.4)
Magnesium: 2.5 mg/dL — ABNORMAL HIGH (ref 1.7–2.4)

## 2022-02-15 MED ORDER — TRAMADOL HCL 50 MG PO TABS
50.0000 mg | ORAL_TABLET | Freq: Four times a day (QID) | ORAL | Status: DC | PRN
  Administered 2022-02-15 – 2022-02-19 (×8): 50 mg via ORAL
  Filled 2022-02-15 (×9): qty 1

## 2022-02-15 MED ORDER — TRAZODONE HCL 50 MG PO TABS
50.0000 mg | ORAL_TABLET | Freq: Every day | ORAL | Status: DC
  Administered 2022-02-15 – 2022-02-18 (×4): 50 mg via ORAL
  Filled 2022-02-15 (×4): qty 1

## 2022-02-15 MED ORDER — FENTANYL CITRATE PF 50 MCG/ML IJ SOSY: 50.0000 ug | PREFILLED_SYRINGE | INTRAMUSCULAR | Status: DC | PRN

## 2022-02-15 MED ORDER — INSULIN ASPART 100 UNIT/ML IJ SOLN
0.0000 [IU] | INTRAMUSCULAR | Status: DC
  Administered 2022-02-15 – 2022-02-16 (×2): 2 [IU] via SUBCUTANEOUS

## 2022-02-15 MED ORDER — KETOROLAC TROMETHAMINE 15 MG/ML IJ SOLN
15.0000 mg | Freq: Four times a day (QID) | INTRAMUSCULAR | Status: AC | PRN
  Administered 2022-02-15 – 2022-02-16 (×3): 15 mg via INTRAVENOUS
  Filled 2022-02-15 (×3): qty 1

## 2022-02-15 MED ORDER — POTASSIUM CHLORIDE 10 MEQ/50ML IV SOLN
10.0000 meq | INTRAVENOUS | Status: AC
  Administered 2022-02-15 (×3): 10 meq via INTRAVENOUS
  Filled 2022-02-15 (×3): qty 50

## 2022-02-15 MED ORDER — FENTANYL CITRATE PF 50 MCG/ML IJ SOSY
50.0000 ug | PREFILLED_SYRINGE | INTRAMUSCULAR | Status: DC | PRN
  Administered 2022-02-15 – 2022-02-16 (×2): 50 ug via INTRAVENOUS
  Filled 2022-02-15 (×2): qty 1

## 2022-02-15 MED ORDER — GABAPENTIN 100 MG PO CAPS
200.0000 mg | ORAL_CAPSULE | Freq: Three times a day (TID) | ORAL | Status: DC
  Administered 2022-02-15 – 2022-02-19 (×13): 200 mg via ORAL
  Filled 2022-02-15 (×13): qty 2

## 2022-02-15 MED ORDER — FUROSEMIDE 10 MG/ML IJ SOLN
20.0000 mg | Freq: Two times a day (BID) | INTRAMUSCULAR | Status: DC
  Administered 2022-02-15 – 2022-02-16 (×4): 20 mg via INTRAVENOUS
  Filled 2022-02-15 (×4): qty 2

## 2022-02-15 MED ORDER — METOCLOPRAMIDE HCL 5 MG/ML IJ SOLN
10.0000 mg | Freq: Four times a day (QID) | INTRAMUSCULAR | Status: AC
  Administered 2022-02-15 – 2022-02-17 (×8): 10 mg via INTRAVENOUS
  Filled 2022-02-15 (×8): qty 2

## 2022-02-15 MED ORDER — INSULIN ASPART 100 UNIT/ML IJ SOLN: 0.0000 [IU] | INTRAMUSCULAR | Status: DC

## 2022-02-15 MED ORDER — KETOROLAC TROMETHAMINE 30 MG/ML IJ SOLN
30.0000 mg | Freq: Once | INTRAMUSCULAR | Status: AC
  Administered 2022-02-15: 30 mg via INTRAVENOUS
  Filled 2022-02-15: qty 1

## 2022-02-15 NOTE — Progress Notes (Signed)
Patient ID: [name], female   DOB: 1961/04/03, 61 y.o.   MRN: [id number] ? ?TCTS Evening Rounds: ? ?Hemodynamically stable  ?Atrial paced 80 ? ?Sats 92% on 6L. ? ?UO ok ? ?CT output decreasing today. ? ?BMET ?   ?Component Value Date/Time  ? NA 140 02/15/2022 1635  ? K 3.9 02/15/2022 1635  ? CL 107 02/15/2022 1635  ? CO2 28 02/15/2022 1635  ? GLUCOSE 124 (H) 02/15/2022 1635  ? BUN 10 02/15/2022 1635  ? CREATININE 0.67 02/15/2022 1635  ? CALCIUM 8.1 (L) 02/15/2022 1635  ? GFRNONAA >60 02/15/2022 1635  ? ?CBC ?   ?Component Value Date/Time  ? WBC 9.5 02/15/2022 1635  ? RBC 3.09 (L) 02/15/2022 1635  ? HGB 10.0 (L) 02/15/2022 1635  ? HCT 29.5 (L) 02/15/2022 1635  ? PLT 182 02/15/2022 1635  ? MCV 95.5 02/15/2022 1635  ? MCH 32.4 02/15/2022 1635  ? MCHC 33.9 02/15/2022 1635  ? RDW 12.9 02/15/2022 1635  ? ? ?

## 2022-02-15 NOTE — Progress Notes (Signed)
1 Day Post-Op Procedure(s) (LRB): ?CORONARY ARTERY BYPASS GRAFTING (CABG) X THREE BYPASSES. USING LEFT INTERNAL MAMMARY ARTERY & RIGHT GREATER SAPHENOUS VEIN HARVESTED ENDOSCOPICALLY. (N/A) ?TRANSESOPHAGEAL ECHOCARDIOGRAM (TEE) (N/A) ?Subjective: ?Pain control an issue with allergies to morphine, codeine ?Extubated, CXR clear ?Nsr off inotropes ?Objective: ?Vital signs in last 24 hours: ?Temp:  [95.4 ?F (35.2 ?C)-100.6 ?F (38.1 ?C)] 100.2 ?F (37.9 ?C) (04/19 [id number]) ?Pulse Rate:  [79-82] 80 (04/19 0733) ?Cardiac Rhythm: Atrial paced (04/19 0730) ?Resp:  [12-25] 20 (04/19 0733) ?BP: (85-136)/(49-102) 103/68 (04/19 0730) ?SpO2:  [91 %-100 %] 94 % (04/19 0733) ?Arterial Line BP: (77-297)/(10-117) 84/71 (04/19 [id number]) ?FiO2 (%):  [40 %-50 %] 40 % (04/18 1900) ?Weight:  [91.7 kg] 91.7 kg (04/19 0500) ? ?Hemodynamic parameters for last 24 hours: ?PAP: (16-40)/(4-22) 32/15 ?CO:  [3.4 L/min-5.1 L/min] 5.1 L/min ?CI:  [1.8 L/min/m2-2.7 L/min/m2] 2.7 L/min/m2 ? ?Intake/Output from previous day: ?04/18 0701 - 04/19 0700 ?In: [id number] [P.O.:480; I.V.:2917.5; Blood:250; IV Piggyback:1838.5] ?Out: [id number] [Urine:2845; Blood:429; Chest Tube:550] ?Intake/Output this shift: ?Total I/O ?In: -  ?Out: 46 [Chest Tube:50] ? ?  ?   Exam ? ?  General- alert and comfortable ?   Neck- no JVD, no cervical adenopathy palpable, no carotid bruit ?  Lungs- clear without rales, wheezes, no air leak thru chest tubes ?  Cor- regular rate and rhythm, no murmur , gallop ?  Abdomen- soft, non-tender ?  Extremities - warm, non-tender, minimal edema ?  Neuro- oriented, appropriate, no focal weakness ? ? ?Lab Results: ?Recent Labs  ?  02/14/22 ?1949 02/14/22 ?2120 02/15/22 ?0424  ?WBC 7.7  --  9.0  ?HGB 10.5* 8.8* 10.0*  ?HCT 32.7* 26.0* 29.4*  ?PLT 179  --  194  ? ?BMET:  ?Recent Labs  ?  02/14/22 ?2356 02/15/22 ?0424  ?NA 142 138  ?K 3.8 3.5  ?CL 114* 111  ?CO2 23 23  ?GLUCOSE 110* 113*  ?BUN 11 10  ?CREATININE 0.46 0.46  ?CALCIUM 7.3* 7.2*  ?  ?PT/INR:  ?Recent  Labs  ?  02/14/22 ?1420  ?LABPROT 16.6*  ?INR 1.4*  ? ?ABG ?   ?Component Value Date/Time  ? PHART 7.316 (L) 02/14/2022 2120  ? HCO3 22.4 02/14/2022 2120  ? TCO2 24 02/14/2022 2120  ? ACIDBASEDEF 3.0 (H) 02/14/2022 2120  ? O2SAT 92 02/14/2022 2120  ? ?CBG (last 3)  ?Recent Labs  ?  02/15/22 ?0237 02/15/22 ?0423 02/15/22 ?0646  ?GLUCAP 116* 108* 107*  ? ? ?Assessment/Plan: ?S/P Procedure(s) (LRB): ?CORONARY ARTERY BYPASS GRAFTING (CABG) X THREE BYPASSES. USING LEFT INTERNAL MAMMARY ARTERY & RIGHT GREATER SAPHENOUS VEIN HARVESTED ENDOSCOPICALLY. (N/A) ?TRANSESOPHAGEAL ECHOCARDIOGRAM (TEE) (N/A) ?Mobilize ?Diuresis ?See progression orders ?Cont reglan for gastric dilatation on KUB ? ? LOS: 1 day  ? ? ?[name] ?02/15/2022 ?  ?

## 2022-02-15 NOTE — Discharge Summary (Signed)
? ?   ?[address] ?      [place] [address] ?            [phone]   ? ?Physician Discharge Summary  ?Patient ID: ?[name] ?MRN: [id number] ?DOB/AGE: 1961-06-04 61 y.o. ? ?Admit date: 02/14/2022 ?Discharge date: 02/19/2022 ? ?Admission Diagnoses: ? ?Patient Active Problem List  ? Diagnosis Date Noted  ? CAD (coronary artery disease) 02/08/2022  ? Chest pressure 11/29/2015  ? Dyspnea on exertion 11/29/2015  ? Family history of coronary artery disease 11/29/2015  ? Panlobular emphysema ([place]) 11/29/2015  ? Essential hypertension 10/14/2015  ? Fatigue 10/14/2015  ? Frequent UTI 10/14/2015  ? Left lower quadrant pain 10/14/2015  ? ?Discharge Diagnoses:  ?Patient Active Problem List  ? Diagnosis Date Noted  ? S/P CABG x 3 02/14/2022  ? CAD (coronary artery disease) 02/08/2022  ? Chest pressure 11/29/2015  ? Dyspnea on exertion 11/29/2015  ? Family history of coronary artery disease 11/29/2015  ? Panlobular emphysema ([place]) 11/29/2015  ? Essential hypertension 10/14/2015  ? Fatigue 10/14/2015  ? Frequent UTI 10/14/2015  ? Left lower quadrant pain 10/14/2015  ? ?Discharged Condition: good ? ?  ?History of Present Illness:    ?  ?[name] is a 61 year old female 40-pack-year former smoker having quit in 2011 with a past medical history significant for dyslipidemia, COPD, hypertension, borderline diabetes, and gastroesophageal reflux disease.  She has a positive family history for premature coronary artery disease in both her father and mother.  She began having some chest discomfort back in February of this year and underwent a stress test showing no evidence of reversible ischemia.  An echocardiogram done at the same time showed LV function of 60 to 65% and mild aortic stenosis.  Since those studies were done in February, she has continued to have intermittent exertional chest discomfort.  She has not been under the care of a cardiologist but was scheduled to see Dr. Bettina Gavia  in [place] on 02/23/2022.  ? Two days ago, she had an episode of chest pressure radiating to her left shoulder and left arm while she was driving.  This was associated with diaphoresis and nausea.  She presented to [facility] and was admitted for evaluation.  EKG showed nonspecific ST upsloping in the inferior and lateral leads.  Troponin was reportedly negative.  After evaluation by a cardiologist, transfer to [facility] for further evaluation with left heart catheterization was recommended. ?Left heart cath was performed and demonstrated significant left main and multivessel coronary artery disease.  There is a 50% stenosis in the distal left main coronary.  There is a 40% mid LAD stenosis.  The circumflex has a 70% stenosis in between the origins of the first and second obtuse marginals.  The right coronary artery has a 75% mid stenosis. ?A left ventriculogram was not performed.  The LVEDP was 11 mmHg. ?CT surgery has been asked to evaluate [name] for consideration of coronary bypass grafting. ?  ?Ms. Darwish is left handed. She has a full plate denture on top and has several teeth in her lower jaw that are bothersome.  ? ?Dr. Darcey Nora evaluated the patient and all relevant studies and recommended proceeding with CABG. ? ?Hospital course:  ? ?The patient was admitted electively on 02/14/2022 and taken to the operating room at which time she underwent CABG x3.  She tolerated the procedure well was taken to the surgical intensive care unit in stable  condition.  The patient was extubated the evening of surgery.  The patient had gastric dilatation on KUB post operatively.  She was started on reglan.  The patient is bradycardic and her Lopressor was discontinued.  The patient underwent debridement of chest tubes and arterial lines.  She had issues with pain control due to allergies to narcotics.  She was treated with Toradol with good response.  She was stable for transfer to the progressive care unit on  02/16/2022.  The patient's heart rate improved with rates in the 70s.  She remained in NSR.  Her pacing wires were removed without difficulty.  The patient was treated with lasix for mild edema and she responded well.  She was started on low dose BB prior to discharge.  She did show increasing blood pressure trends and her home antihypertensive medication regimen was resumed.  She has a significant history of emphysema with a 40+ pack year cigarette history.  She did quit in 2011.  It was thought that she might require home oxygen but she was eventually able to be weaned off with acceptable parameters.  She is ambulating independently.  Her surgical incisions are healing without evidence of infection.  She is medically stable for discharge home today. ? ?Consults: None ? ?Significant Diagnostic Studies: angiography:  ? ?  Mid RCA lesion is 75% stenosed. ?  Mid LAD lesion is 40% stenosed. ?  Mid Cx lesion is 70% stenosed. ?  Dist LM lesion is 50% stenosed. ?  LV end diastolic pressure is normal. ?  ?1.  Multivessel disease consisting of distal left main, left circumflex and right coronary artery.  The arterial tree is diffusely calcified. ?2.  LVEDP of 11 mmHg. ?  ?Recommendation: Cardiothoracic surgical consult. ? ?Treatments: surgery:  ? ?Operative Report  ?  ?DATE OF PROCEDURE: 02/14/2022 ?  ?OPERATION:    ?1.  Coronary artery bypass grafting x3 (left internal mammary artery to LAD, saphenous vein graft to posterior descending, saphenous vein graft to circumflex marginal). ?2.  Endoscopic harvest of right leg greater saphenous vein. ?  ?PREOPERATIVE DIAGNOSIS:  Significant left main and multivessel coronary artery disease with unstable angina. ?  ?POSTOPERATIVE DIAGNOSIS:  Significant left main and multivessel coronary artery disease with unstable angina. ?  ?SURGEON:  Len Childs, MD ?  ?ASSISTANT:  Jadene Pierini PA-C ?  ?Discharge Exam: ?Blood pressure 114/73, pulse 81, temperature 98 ?F (36.7 ?C),  temperature source Oral, resp. rate 20, height 5\' 4"  (1.626 m), weight 86.9 kg, SpO2 95 %. ? ? ?General appearance: alert, cooperative, fatigued, and no distress ?Heart: regular rate and rhythm ?Lungs: dim right base>left base ?Abdomen: benign ?Extremities: no edema ?Wound: incis healing well ?Discharge Medications: ? ?The patient has been discharged on: ? ?General appearance: alert, cooperative, fatigued, and no distress ?Heart: regular rate and rhythm ?Lungs: dim right base>left base ?Abdomen: benign ?Extremities: no edema ?Wound: incis healing well ?1.Beta Blocker:  Yes [  y ] ?                             No   [   ] ?                             If No, reason: ? ?2.Ace Inhibitor/ARB: Yes [ y  ] ?  No  [    ] ?                                    If No, reason: ? ?3.Statin:   Yes [ X  ] ?                 No  [   ] ?                 If No, reason: ? ?4.Ecasa:  Yes  [ X  ] ?                 No   [   ] ?                 If No, reason: ? ?Patient had ACS upon admission: ? ?Plavix/P2Y12 inhibitor: Yes [   ] ?                                     No  [ x  ] ? ? ? ? ?Discharge Instructions   ? ? Amb Referral to Cardiac Rehabilitation   Complete by: As directed ?  ? To   ? Diagnosis: CABG  ? CABG X ___: 3  ? After initial evaluation and assessments completed: Virtual Based Care may be provided alone or in conjunction with Phase 2 Cardiac Rehab based on patient barriers.: Yes  ? Discharge patient   Complete by: As directed ?  ? Discharge disposition: 01-Home or Self Care  ? Discharge patient date: 02/19/2022  ? ?  ? ?Allergies as of 02/19/2022   ? ?   Reactions  ? Codeine Anaphylaxis  ? Locked up  ? Bismuth Subsalicylate Nausea Only  ? ?  ? ?  ?Medication List  ?  ? ?STOP taking these medications   ? ?ALPRAZolam 1 MG tablet ?Commonly known as: Xanax ?  ?isosorbide mononitrate 30 MG 24 hr tablet ?Commonly known as: IMDUR ?  ? ?  ? ?TAKE these medications   ? ?aspirin 325 MG EC  tablet ?Take 1 tablet (325 mg total) by mouth daily. ?What changed:  ?medication strength ?how much to take ?additional instructions ?  ?atorvastatin 80 MG tablet ?Commonly known as: LIPITOR ?Take 1 tablet (80 mg tot

## 2022-02-15 NOTE — Progress Notes (Signed)
RT note-No ABG needed at this time, patient extubated, not required. ?

## 2022-02-16 ENCOUNTER — Inpatient Hospital Stay (HOSPITAL_COMMUNITY): Payer: BC Managed Care – PPO

## 2022-02-16 LAB — CBC
HCT: 26.5 % — ABNORMAL LOW (ref 36.0–46.0)
Hemoglobin: 8.9 g/dL — ABNORMAL LOW (ref 12.0–15.0)
MCH: 32.2 pg (ref 26.0–34.0)
MCHC: 33.6 g/dL (ref 30.0–36.0)
MCV: 96 fL (ref 80.0–100.0)
Platelets: 141 10*3/uL — ABNORMAL LOW (ref 150–400)
RBC: 2.76 MIL/uL — ABNORMAL LOW (ref 3.87–5.11)
RDW: 12.6 % (ref 11.5–15.5)
WBC: 9.3 10*3/uL (ref 4.0–10.5)
nRBC: 0 % (ref 0.0–0.2)

## 2022-02-16 LAB — GLUCOSE, CAPILLARY
Glucose-Capillary: 104 mg/dL — ABNORMAL HIGH (ref 70–99)
Glucose-Capillary: 128 mg/dL — ABNORMAL HIGH (ref 70–99)

## 2022-02-16 LAB — BASIC METABOLIC PANEL
Anion gap: 5 (ref 5–15)
BUN: 13 mg/dL (ref 6–20)
CO2: 26 mmol/L (ref 22–32)
Calcium: 7.8 mg/dL — ABNORMAL LOW (ref 8.9–10.3)
Chloride: 103 mmol/L (ref 98–111)
Creatinine, Ser: 0.53 mg/dL (ref 0.44–1.00)
GFR, Estimated: >60 mL/min (ref >60)
Glucose, Bld: 108 mg/dL — ABNORMAL HIGH (ref 70–99)
Potassium: 3.3 mmol/L — ABNORMAL LOW (ref 3.5–5.1)
Sodium: 134 mmol/L — ABNORMAL LOW (ref 135–145)

## 2022-02-16 LAB — ECHO INTRAOPERATIVE TEE
Height: 64 in
Weight: 3008 oz

## 2022-02-16 MED ORDER — SODIUM CHLORIDE 0.9% FLUSH: 3.0000 mL | Freq: Two times a day (BID) | INTRAVENOUS | Status: DC

## 2022-02-16 MED ORDER — POTASSIUM CHLORIDE CRYS ER 20 MEQ PO TBCR
40.0000 meq | EXTENDED_RELEASE_TABLET | Freq: Once | ORAL | Status: AC
  Administered 2022-02-16: 40 meq via ORAL
  Filled 2022-02-16: qty 2

## 2022-02-16 MED ORDER — ~~LOC~~ CARDIAC SURGERY, PATIENT & FAMILY EDUCATION: Freq: Once | Status: AC

## 2022-02-16 MED ORDER — SODIUM CHLORIDE 0.9 % IV SOLN: 250.0000 mL | INTRAVENOUS | Status: DC | PRN

## 2022-02-16 MED ORDER — POTASSIUM CHLORIDE 10 MEQ/50ML IV SOLN
10.0000 meq | INTRAVENOUS | Status: AC
  Administered 2022-02-16 (×3): 10 meq via INTRAVENOUS
  Filled 2022-02-16 (×3): qty 50

## 2022-02-16 MED ORDER — SODIUM CHLORIDE 0.9% FLUSH: 3.0000 mL | INTRAVENOUS | Status: DC | PRN

## 2022-02-16 MED ORDER — KETOROLAC TROMETHAMINE 15 MG/ML IJ SOLN
15.0000 mg | Freq: Once | INTRAMUSCULAR | Status: AC
  Administered 2022-02-16: 15 mg via INTRAVENOUS
  Filled 2022-02-16: qty 1

## 2022-02-16 NOTE — Progress Notes (Signed)
?  Transition of Care (TOC) Screening Note ? ? ?Patient Details  ?Name: [name] ?Date of Birth: 1961-08-10 ? ? ?Transition of Care (TOC) CM/SW Contact:    ?Dahlia Client, Romeo Rabon, RN ?Phone Number: ?02/16/2022, 3:22 PM ? ? ? ?Transition of Care Department [facility]) has reviewed patient and note pt s/p CABG. We will continue to monitor patient advancement through interdisciplinary progression rounds. If new patient transition needs arise, please place a TOC consult. ?  ?

## 2022-02-16 NOTE — Anesthesia Postprocedure Evaluation (Signed)
Anesthesia Post Note ? ?Patient: Shauntell Iglesia ? ?Procedure(s) Performed: CORONARY ARTERY BYPASS GRAFTING (CABG) X THREE BYPASSES. USING LEFT INTERNAL MAMMARY ARTERY & RIGHT GREATER SAPHENOUS VEIN HARVESTED ENDOSCOPICALLY. (Chest) ?TRANSESOPHAGEAL ECHOCARDIOGRAM (TEE) ? ?  ? ?Patient location during evaluation: SICU ?Anesthesia Type: General ?Level of consciousness: sedated ?Pain management: pain level controlled ?Vital Signs Assessment: post-procedure vital signs reviewed and stable ?Respiratory status: patient remains intubated per anesthesia plan ?Cardiovascular status: stable ?Postop Assessment: no apparent nausea or vomiting ?Anesthetic complications: no ? ? ?No notable events documented. ? ?Last Vitals:  ?Vitals:  ? 02/16/22 1516 02/16/22 1709  ?BP: 107/70 125/68  ?Pulse: 69 71  ?Resp: 15 18  ?Temp: 36.7 ?C 36.8 ?C  ?SpO2: 95% 97%  ?  ?Last Pain:  ?Vitals:  ? 02/16/22 1709  ?TempSrc: Oral  ?PainSc:   ? ? ?  ?  ?  ?  ?  ?  ? ?Daneli Butkiewicz S ? ? ? ? ?

## 2022-02-16 NOTE — Progress Notes (Signed)
? ?   ?  [address] ?      [place] [address] ?            [phone]   ? ?  ?2 Days Post-Op Procedure(s) (LRB): ?CORONARY ARTERY BYPASS GRAFTING (CABG) X THREE BYPASSES. USING LEFT INTERNAL MAMMARY ARTERY & RIGHT GREATER SAPHENOUS VEIN HARVESTED ENDOSCOPICALLY. (N/A) ?TRANSESOPHAGEAL ECHOCARDIOGRAM (TEE) (N/A) ? ?Subjective: ? ?Patient without complaints.  Wants to get rid of all the lines, tubes today.   ? ?Objective: ?Vital signs in last 24 hours: ?Temp:  [98.5 ?F (36.9 ?C)-100.4 ?F (38 ?C)] 98.8 ?F (37.1 ?C) (04/20 0400) ?Pulse Rate:  [63-81] 80 (04/20 0700) ?Cardiac Rhythm: Atrial paced (04/20 0000) ?Resp:  [12-24] 18 (04/20 0700) ?BP: (87-120)/(59-84) 100/59 (04/20 0700) ?SpO2:  [87 %-97 %] 96 % (04/20 0700) ?Arterial Line BP: (77-86)/(67-70) 77/67 (04/19 0900) ?Weight:  [90.1 kg] 90.1 kg (04/20 0500) ? ?Hemodynamic parameters for last 24 hours: ?PAP: (33-35)/(15-18) 35/18 ? ?Intake/Output from previous day: ?04/19 0701 - 04/20 0700 ?In: 696.3 [P.O.:480; I.V.:16.3; IV Piggyback:200] ?Out: 2435 [Urine:1695; Chest Tube:740] ? ?General appearance: alert, cooperative, and no distress ?Heart: regular rate and rhythm ?Lungs: clear to auscultation bilaterally ?Abdomen: soft, non-tender; bowel sounds normal; no masses,  no organomegaly ?Extremities: edema trace, ecchymosis right thigh ?Wound: aquacel on sternotomy ? ?Lab Results: ?Recent Labs  ?  02/15/22 ?1635 02/16/22 ?2633  ?WBC 9.5 9.3  ?HGB 10.0* 8.9*  ?HCT 29.5* 26.5*  ?PLT 182 141*  ? ?BMET:  ?Recent Labs  ?  02/15/22 ?1635 02/16/22 ?3545  ?NA 140 134*  ?K 3.9 3.3*  ?CL 107 103  ?CO2 28 26  ?GLUCOSE 124* 108*  ?BUN 10 13  ?CREATININE 0.67 0.53  ?CALCIUM 8.1* 7.8*  ?  ?PT/INR:  ?Recent Labs  ?  02/14/22 ?1420  ?LABPROT 16.6*  ?INR 1.4*  ? ?ABG ?   ?Component Value Date/Time  ? PHART 7.316 (L) 02/14/2022 2120  ? HCO3 22.4 02/14/2022 2120  ? TCO2 24 02/14/2022 2120  ? ACIDBASEDEF 3.0 (H) 02/14/2022 2120  ? O2SAT 92 02/14/2022 2120  ? ?CBG (last  3)  ?Recent Labs  ?  02/15/22 ?2039 02/16/22 ?6256 02/16/22 ?0450  ?GLUCAP 98 128* 104*  ? ? ?Assessment/Plan: ?S/P Procedure(s) (LRB): ?CORONARY ARTERY BYPASS GRAFTING (CABG) X THREE BYPASSES. USING LEFT INTERNAL MAMMARY ARTERY & RIGHT GREATER SAPHENOUS VEIN HARVESTED ENDOSCOPICALLY. (N/A) ?TRANSESOPHAGEAL ECHOCARDIOGRAM (TEE) (N/A) ? ?CV- Sinus Bradycardia- will tape pacing wires today, Lopressor has been discontinued, BP is labile/low running 80-100s ?Pulm- CT output is serous, 300 cc output, will remove today, repeat CXR in AM ?Renal- creatinine remains WNL at 0.53, weight is above baseline, continue Lasix, potassium ?Pain control- improved with toradol, will continue with ordered doses ?Expected post operative blood loss anemia- mild Hgb at 8.9 ?CBGs- controlled, patient is not a diabetic, will d/c SSIP ?Dispo- patient stable, will proceed with debridement of chest tubes, lines, foley.. continue to hold BB due to bradycardia, continue diuretics, toradol for now.. transfer patient to 4E ? ? LOS: 2 days  ? ?Lowella Dandy, PA-C ?02/16/2022 ? ? ?

## 2022-02-16 NOTE — Discharge Instructions (Signed)

## 2022-02-16 NOTE — Progress Notes (Signed)
Patient arrived to 4E from 2 Heart. Alert and oriented. Transferred from wheelchair to hospital bed. Vitals taken and stable. Tele placed and CCMD notified. Midsternal incision assessed. CHG completed. Last BM 4/20 directly before transfer. Call bell within reach. Husband at the bedside.  ?[place] C Mckynzie Liwanag  ?

## 2022-02-17 ENCOUNTER — Inpatient Hospital Stay (HOSPITAL_COMMUNITY): Payer: BC Managed Care – PPO

## 2022-02-17 LAB — BASIC METABOLIC PANEL
Anion gap: 6 (ref 5–15)
BUN: 14 mg/dL (ref 6–20)
CO2: 27 mmol/L (ref 22–32)
Calcium: 8.1 mg/dL — ABNORMAL LOW (ref 8.9–10.3)
Chloride: 106 mmol/L (ref 98–111)
Creatinine, Ser: 0.47 mg/dL (ref 0.44–1.00)
GFR, Estimated: >60 mL/min (ref >60)
Glucose, Bld: 98 mg/dL (ref 70–99)
Potassium: 3.6 mmol/L (ref 3.5–5.1)
Sodium: 139 mmol/L (ref 135–145)

## 2022-02-17 LAB — CBC
HCT: 28.4 % — ABNORMAL LOW (ref 36.0–46.0)
Hemoglobin: 9.6 g/dL — ABNORMAL LOW (ref 12.0–15.0)
MCH: 32.1 pg (ref 26.0–34.0)
MCHC: 33.8 g/dL (ref 30.0–36.0)
MCV: 95 fL (ref 80.0–100.0)
Platelets: 154 10*3/uL (ref 150–400)
RBC: 2.99 MIL/uL — ABNORMAL LOW (ref 3.87–5.11)
RDW: 12.2 % (ref 11.5–15.5)
WBC: 8.6 10*3/uL (ref 4.0–10.5)
nRBC: 0 % (ref 0.0–0.2)

## 2022-02-17 MED ORDER — POTASSIUM CHLORIDE CRYS ER 20 MEQ PO TBCR
20.0000 meq | EXTENDED_RELEASE_TABLET | Freq: Every day | ORAL | Status: DC
  Administered 2022-02-17: 20 meq via ORAL
  Filled 2022-02-17: qty 1

## 2022-02-17 MED ORDER — FUROSEMIDE 40 MG PO TABS
40.0000 mg | ORAL_TABLET | Freq: Every day | ORAL | Status: DC
  Administered 2022-02-17: 40 mg via ORAL
  Filled 2022-02-17: qty 1

## 2022-02-17 MED FILL — Electrolyte-R (PH 7.4) Solution: INTRAVENOUS | Qty: 4000 | Status: AC

## 2022-02-17 MED FILL — Heparin Sodium (Porcine) Inj 1000 Unit/ML: Qty: 1000 | Status: AC

## 2022-02-17 MED FILL — Sodium Bicarbonate IV Soln 8.4%: INTRAVENOUS | Qty: 50 | Status: AC

## 2022-02-17 MED FILL — Mannitol IV Soln 20%: INTRAVENOUS | Qty: 500 | Status: AC

## 2022-02-17 MED FILL — Magnesium Sulfate Inj 50%: INTRAMUSCULAR | Qty: 10 | Status: AC

## 2022-02-17 MED FILL — Potassium Chloride Inj 2 mEq/ML: INTRAVENOUS | Qty: 40 | Status: AC

## 2022-02-17 MED FILL — Sodium Chloride IV Soln 0.9%: INTRAVENOUS | Qty: 2000 | Status: AC

## 2022-02-17 MED FILL — Heparin Sodium (Porcine) Inj 1000 Unit/ML: INTRAMUSCULAR | Qty: 10 | Status: AC

## 2022-02-17 MED FILL — Lidocaine HCl Local Soln Prefilled Syringe 100 MG/5ML (2%): INTRAMUSCULAR | Qty: 5 | Status: AC

## 2022-02-17 NOTE — Progress Notes (Signed)
CARDIAC REHAB PHASE I  ? ?PRE:  Rate/Rhythm: 77 SR ? ?  BP: sitting 112/67 ? ?  SaO2: 94 4L finger, 85 1L finger ? ?MODE:  Ambulation: 550 ft  ? ?POST:  Rate/Rhythm: 88 SR ? ?  BP: sitting 126/77  ? ?  SaO2: 90-92 RA ear ? ?Pt moving very well, independently. She is eager to ambulate. Pt on 4-5 L in room. In hall placed probe on ear and SaO2 92-96 2L. Decreased O2 to RA and pt 90-92 RA on ear. Also tried finger and SaO2 reads 75-85 RA on finger, with good pleth. Think ear is accurate as pt is not SOB while walking. Left probe on ear in room, on 1L in bed, SaO2 90. Encouraged IS. Pt eager to improve. Notified RN. ?[id number] ?Yves Dill CES, ACSM ?02/17/2022 ?12:01 PM ? ? ? ? ?

## 2022-02-17 NOTE — Progress Notes (Addendum)
? ?   ?  [address] ?      [place] [address] ?            [phone]   ? ?  ?3 Days Post-Op Procedure(s) (LRB): ?CORONARY ARTERY BYPASS GRAFTING (CABG) X THREE BYPASSES. USING LEFT INTERNAL MAMMARY ARTERY & RIGHT GREATER SAPHENOUS VEIN HARVESTED ENDOSCOPICALLY. (N/A) ?TRANSESOPHAGEAL ECHOCARDIOGRAM (TEE) (N/A) ? ?Subjective: ? ?Patient doing well.  Her pain is better after removal of chest tubes ? ?Objective: ?Vital signs in last 24 hours: ?Temp:  [98 ?F (36.7 ?C)-98.7 ?F (37.1 ?C)] 98.4 ?F (36.9 ?C) (04/21 0453) ?Pulse Rate:  [61-81] 70 (04/21 0453) ?Cardiac Rhythm: Normal sinus rhythm (04/20 2000) ?Resp:  [13-20] 14 (04/21 0453) ?BP: (101-125)/(58-88) 123/88 (04/21 0453) ?SpO2:  [94 %-98 %] 98 % (04/21 0453) ?Weight:  [90.1 kg] 90.1 kg (04/21 0453) ? ?Intake/Output from previous day: ?04/20 0701 - 04/21 0700 ?In: 589.9 [P.O.:240; IV Piggyback:349.9] ?Out: 744 [Urine:743; Stool:1] ? ?General appearance: alert, cooperative, and no distress ?Heart: regular rate and rhythm ?Lungs: clear to auscultation bilaterally ?Abdomen: soft, non-tender; bowel sounds normal; no masses,  no organomegaly ?Extremities: edema trace ?Wound: clean and dry ? ?Lab Results: ?Recent Labs  ?  02/16/22 ?9147 02/17/22 ?0137  ?WBC 9.3 8.6  ?HGB 8.9* 9.6*  ?HCT 26.5* 28.4*  ?PLT 141* 154  ? ?BMET:  ?Recent Labs  ?  02/16/22 ?8295 02/17/22 ?0137  ?NA 134* 139  ?K 3.3* 3.6  ?CL 103 106  ?CO2 26 27  ?GLUCOSE 108* 98  ?BUN 13 14  ?CREATININE 0.53 0.47  ?CALCIUM 7.8* 8.1*  ?  ?PT/INR:  ?Recent Labs  ?  02/14/22 ?1420  ?LABPROT 16.6*  ?INR 1.4*  ? ?ABG ?   ?Component Value Date/Time  ? PHART 7.316 (L) 02/14/2022 2120  ? HCO3 22.4 02/14/2022 2120  ? TCO2 24 02/14/2022 2120  ? ACIDBASEDEF 3.0 (H) 02/14/2022 2120  ? O2SAT 92 02/14/2022 2120  ? ?CBG (last 3)  ?Recent Labs  ?  02/15/22 ?2039 02/16/22 ?6213 02/16/22 ?0450  ?GLUCAP 98 128* 104*  ? ? ?Assessment/Plan: ?S/P Procedure(s) (LRB): ?CORONARY ARTERY BYPASS GRAFTING (CABG) X  THREE BYPASSES. USING LEFT INTERNAL MAMMARY ARTERY & RIGHT GREATER SAPHENOUS VEIN HARVESTED ENDOSCOPICALLY. (N/A) ?TRANSESOPHAGEAL ECHOCARDIOGRAM (TEE) (N/A) ? ?CV- NSR, HR stable in the 70s- will continue to hold BB for now, can hopefully start low dose prior to discharge, will d/c EPW today ?Pulm- CXR w/o pneumothorax, continued atelectasis, continue IS, wean oxygen as tolerated ?Renal-creatinine has been stable at 0.47, weight is trending down, will transition to oral lasix, potassium ?Expected post operative blood loss anemia, remains stable ?Dispo- patient stable, will d/c EPW today, can  hopefully start low dose BB prior to discharge, if patient remains stable for possible d/c in next 24-48 hours ? ? LOS: 3 days  ? ? ?Lowella Dandy, PA-C ?02/17/2022 ? ? Chart reviewed, patient examined, agree with above. ?She feels well. Bowels working. Walking well. ?Plan home tomorrow if no changes. ?

## 2022-02-17 NOTE — Progress Notes (Signed)
Pt walking hall independently with husband. SaO2 85-88 RA. Pt back to room and reapplied 1L. Now 96-100 1L.  ? ?Discussed with pt and husband IS, sternal precautions, diet, exercise, and CRPII. Pt receptive and requests her referral be sent to [facility].  ?[id number] ?Ethelda Chick CES, ACSM ?3:22 PM ?02/17/2022 ? ?

## 2022-02-17 NOTE — Progress Notes (Signed)
Pacing wires removed, patient tolerated well ?

## 2022-02-18 MED ORDER — METOPROLOL TARTRATE 12.5 MG HALF TABLET
12.5000 mg | ORAL_TABLET | Freq: Two times a day (BID) | ORAL | Status: DC
Start: 2022-02-18 — End: 2022-02-19
  Administered 2022-02-18 – 2022-02-19 (×3): 12.5 mg via ORAL
  Filled 2022-02-18 (×3): qty 1

## 2022-02-18 MED ORDER — FUROSEMIDE 40 MG PO TABS
40.0000 mg | ORAL_TABLET | Freq: Two times a day (BID) | ORAL | Status: DC
Start: 2022-02-18 — End: 2022-02-19
  Administered 2022-02-18: 40 mg via ORAL
  Filled 2022-02-18 (×2): qty 1

## 2022-02-18 MED ORDER — POTASSIUM CHLORIDE CRYS ER 20 MEQ PO TBCR
20.0000 meq | EXTENDED_RELEASE_TABLET | Freq: Two times a day (BID) | ORAL | Status: DC
  Administered 2022-02-18 (×2): 20 meq via ORAL
  Filled 2022-02-18 (×2): qty 1

## 2022-02-18 NOTE — Progress Notes (Addendum)
? ?   ?[address] ?      [place] [address] ?            [phone]   ? ?  4 Days Post-Op Procedure(s) (LRB): ?CORONARY ARTERY BYPASS GRAFTING (CABG) X THREE BYPASSES. USING LEFT INTERNAL MAMMARY ARTERY & RIGHT GREATER SAPHENOUS VEIN HARVESTED ENDOSCOPICALLY. (N/A) ?TRANSESOPHAGEAL ECHOCARDIOGRAM (TEE) (N/A) ?Subjective: ?Some SOB ? ?Objective: ?Vital signs in last 24 hours: ?Temp:  [97.8 ?F (36.6 ?C)-98.8 ?F (37.1 ?C)] 98.2 ?F (36.8 ?C) (04/22 0272) ?Pulse Rate:  [73-81] 73 (04/22 0731) ?Cardiac Rhythm: Normal sinus rhythm (04/21 2036) ?Resp:  [13-20] 20 (04/22 0731) ?BP: (108-162)/(60-98) 148/92 (04/22 0731) ?SpO2:  [90 %-100 %] 91 % (04/22 0731) ?Weight:  [92.2 kg] 92.2 kg (04/22 0350) ? ?Hemodynamic parameters for last 24 hours: ?  ? ?Intake/Output from previous day: ?04/21 0701 - 04/22 0700 ?In: 240 [P.O.:240] ?Out: 800 [Urine:800] ?Intake/Output this shift: ?No intake/output data recorded. ? ?General appearance: alert, cooperative, and no distress ?Heart: regular rate and rhythm ?Lungs: dim in lower fields ?Abdomen: benign ?Extremities: minor edema ?Wound: incis healing well ? ?Lab Results: ?Recent Labs  ?  02/16/22 ?5366 02/17/22 ?0137  ?WBC 9.3 8.6  ?HGB 8.9* 9.6*  ?HCT 26.5* 28.4*  ?PLT 141* 154  ? ?BMET:  ?Recent Labs  ?  02/16/22 ?4403 02/17/22 ?0137  ?NA 134* 139  ?K 3.3* 3.6  ?CL 103 106  ?CO2 26 27  ?GLUCOSE 108* 98  ?BUN 13 14  ?CREATININE 0.53 0.47  ?CALCIUM 7.8* 8.1*  ?  ?PT/INR: No results for input(s): LABPROT, INR in the last 72 hours. ?ABG ?   ?Component Value Date/Time  ? PHART 7.316 (L) 02/14/2022 2120  ? HCO3 22.4 02/14/2022 2120  ? TCO2 24 02/14/2022 2120  ? ACIDBASEDEF 3.0 (H) 02/14/2022 2120  ? O2SAT 92 02/14/2022 2120  ? ?CBG (last 3)  ?Recent Labs  ?  02/15/22 ?2039 02/16/22 ?4742 02/16/22 ?0450  ?GLUCAP 98 128* 104*  ? ? ?Meds ?Scheduled Meds: ? acetaminophen  1,000 mg Oral Q6H  ? Or  ? acetaminophen (TYLENOL) oral liquid 160 mg/5 mL  1,000 mg Per Tube Q6H  ?  aspirin EC  325 mg Oral Daily  ? Or  ? aspirin  324 mg Per Tube Daily  ? atorvastatin  80 mg Oral Daily  ? bisacodyl  10 mg Oral Daily  ? Or  ? bisacodyl  10 mg Rectal Daily  ? Chlorhexidine Gluconate Cloth  6 each Topical Daily  ? docusate sodium  200 mg Oral Daily  ? furosemide  40 mg Oral Daily  ? gabapentin  200 mg Oral TID  ? pantoprazole  40 mg Oral Daily  ? potassium chloride  20 mEq Oral Daily  ? sodium chloride flush  10-40 mL Intracatheter Q12H  ? traZODone  50 mg Oral QHS  ? ?Continuous Infusions: ?PRN Meds:.hydrALAZINE, metoprolol tartrate, ondansetron (ZOFRAN) IV, sodium chloride flush, traMADol ? ?Xrays ?DG Chest 2 View ? ?Result Date: 02/17/2022 ?CLINICAL DATA:  Postoperative chest evaluation. EXAM: CHEST - 2 VIEW COMPARISON:  Portable chest yesterday at 6:01 a.m. FINDINGS: PA Lat views, 5:18 a.m., 02/17/2022. Interval removal mediastinal drain, left chest tube, and right IJ catheter introducer sheath. There is no visible pneumothorax. Post CABG changes are stable. Mediastinal configuration unaltered with mild aortic atherosclerosis. There is mild cardiomegaly, normal caliber of the central vessels. Bibasilar opacities appear similar consistent with atelectasis, consolidation or combination with small bilateral pleural effusions. The mid and  upper lung fields are clear. Overall aeration seems unchanged. IMPRESSION: Interval removal of previously indwelling devices as described above. No change in the bibasilar lung opacities and small layering pleural effusions. Stable cardiac size. Electronically Signed   By: Almira Bar M.D.   On: 02/17/2022 07:25   ? ?Assessment/Plan: ?S/P Procedure(s) (LRB): ?CORONARY ARTERY BYPASS GRAFTING (CABG) X THREE BYPASSES. USING LEFT INTERNAL MAMMARY ARTERY & RIGHT GREATER SAPHENOUS VEIN HARVESTED ENDOSCOPICALLY. (N/A) ?TRANSESOPHAGEAL ECHOCARDIOGRAM (TEE) (N/A) ?POD#4 ?1 afeb, VSS s BP variable 100's-160's, HR 70'-80's -higher overnight,  mostly well controlled, I  think we can add low dose beta blocker, may need additional agent if remains high ?2 sats ok on RA-2 liters ?3 voiding , not all measured, weight up about 7 kg if accurate- will cont to diurese, make lasix BID for now ?4 no new labs or XRAY's ?5 wan O2 and push pulm hygiene , she has fair amount of atx, add flutter valve ?6 she is walking well, continue rehab ? ? LOS: 4 days  ? ? ?Rowe Clack PA-C ?Pager [phone] ?02/18/2022 ?  ? Chart reviewed, patient examined, agree with above. ?She felt well yesterday but sore this am and wanted to go back on oxygen early this morning. She still has mild atelectasis. I doubt wt is accurate. She did not gain 5 lbs overnight and does not look 15 lbs over preop. Will continue diuresis. Encouraged to take pain meds and work on IS. ?

## 2022-02-18 NOTE — Progress Notes (Signed)
CARDIAC REHAB PHASE I  ? ?PRE:  Rate/Rhythm: 76 SR ? ?  BP: sitting 141/93 ? ?  SaO2: see below ? ?MODE:  Ambulation: 400 ft  ? ?POST:  Rate/Rhythm: 92 SR ? ?  BP: sitting 152/88  ? ?  SaO2: see below  ? ?SATURATION QUALIFICATIONS: (This note is used to comply with regulatory documentation for home oxygen) ? ?Patient Saturations on Room Air at Rest = 88-89% ? ?Patient Saturations on Room Air while Ambulating = 86% ? ?Patient Saturations on 2 Liters of oxygen while Ambulating = 93% ? ?Please briefly explain why patient needs home oxygen: desaturation below 88% with ambulation. Hx of COPD/emphysema with recent OHS. ?  ?1020-1050 ?Patient ambulated in hallway. Slow steady gait. 1 standing restbreak for O2 application. Family assisting with O2 tank. Patient denied complaints. Education previously completed. Denied need for reinforcement. Patient encouraged to ambulate as tolerated in hallway with 2 liters O2.  ?Carmisha Larusso Hoover Brunette RN, BSN  ?

## 2022-02-19 LAB — BASIC METABOLIC PANEL
Anion gap: 10 (ref 5–15)
BUN: 9 mg/dL (ref 6–20)
CO2: 26 mmol/L (ref 22–32)
Calcium: 8.8 mg/dL — ABNORMAL LOW (ref 8.9–10.3)
Chloride: 106 mmol/L (ref 98–111)
Creatinine, Ser: 0.48 mg/dL (ref 0.44–1.00)
GFR, Estimated: >60 mL/min (ref >60)
Glucose, Bld: 117 mg/dL — ABNORMAL HIGH (ref 70–99)
Potassium: 3.5 mmol/L (ref 3.5–5.1)
Sodium: 142 mmol/L (ref 135–145)

## 2022-02-19 MED ORDER — GABAPENTIN 100 MG PO CAPS: 200.0000 mg | ORAL_CAPSULE | Freq: Three times a day (TID) | ORAL | 1 refills | Status: DC

## 2022-02-19 MED ORDER — POTASSIUM CHLORIDE CRYS ER 20 MEQ PO TBCR
40.0000 meq | EXTENDED_RELEASE_TABLET | Freq: Once | ORAL | Status: AC
Start: 2022-02-19 — End: 2022-02-19
  Administered 2022-02-19: 40 meq via ORAL
  Filled 2022-02-19: qty 2

## 2022-02-19 MED ORDER — ASPIRIN 325 MG PO TBEC: 325.0000 mg | DELAYED_RELEASE_TABLET | Freq: Every day | ORAL | Status: DC

## 2022-02-19 MED ORDER — HYDROCHLOROTHIAZIDE 25 MG PO TABS: 25.0000 mg | ORAL_TABLET | Freq: Every day | ORAL | 1 refills | Status: DC

## 2022-02-19 MED ORDER — LISINOPRIL 20 MG PO TABS: 20.0000 mg | ORAL_TABLET | Freq: Every day | ORAL | 1 refills | Status: DC

## 2022-02-19 MED ORDER — METOPROLOL TARTRATE 25 MG PO TABS: 12.5000 mg | ORAL_TABLET | Freq: Two times a day (BID) | ORAL | 1 refills | Status: DC

## 2022-02-19 MED ORDER — TRAMADOL HCL 50 MG PO TABS: 50.0000 mg | ORAL_TABLET | Freq: Four times a day (QID) | ORAL | 0 refills | Status: AC | PRN

## 2022-02-19 MED ORDER — HYDROCHLOROTHIAZIDE 25 MG PO TABS
25.0000 mg | ORAL_TABLET | Freq: Every day | ORAL | Status: DC
  Administered 2022-02-19: 25 mg via ORAL
  Filled 2022-02-19: qty 1

## 2022-02-19 MED ORDER — LISINOPRIL 20 MG PO TABS
20.0000 mg | ORAL_TABLET | Freq: Every day | ORAL | Status: DC
  Administered 2022-02-19: 20 mg via ORAL
  Filled 2022-02-19: qty 1

## 2022-02-19 NOTE — Progress Notes (Addendum)
? ?   ?[address] ?      [place] [address] ?            [phone]   ? ?  5 Days Post-Op Procedure(s) (LRB): ?CORONARY ARTERY BYPASS GRAFTING (CABG) X THREE BYPASSES. USING LEFT INTERNAL MAMMARY ARTERY & RIGHT GREATER SAPHENOUS VEIN HARVESTED ENDOSCOPICALLY. (N/A) ?TRANSESOPHAGEAL ECHOCARDIOGRAM (TEE) (N/A) ?Subjective: ?Doesn't feel quite as well this am ? ?Objective: ?Vital signs in last 24 hours: ?Temp:  [97.7 ?F (36.5 ?C)-98.4 ?F (36.9 ?C)] 97.8 ?F (36.6 ?C) (04/23 0350) ?Pulse Rate:  [65-86] 67 (04/23 0350) ?Cardiac Rhythm: Normal sinus rhythm (04/22 2054) ?Resp:  [18-20] 19 (04/23 0350) ?BP: (129-161)/(76-106) 161/106 (04/23 0350) ?SpO2:  [91 %-99 %] 98 % (04/23 0350) ?Weight:  [86.9 kg] 86.9 kg (04/23 0350) ? ?Hemodynamic parameters for last 24 hours: ?  ? ?Intake/Output from previous day: ?04/22 0701 - 04/23 0700 ?In: 240 [P.O.:240] ?Out: -  ?Intake/Output this shift: ?No intake/output data recorded. ? ?General appearance: alert, cooperative, fatigued, and no distress ?Heart: regular rate and rhythm ?Lungs: dim right base>left base ?Abdomen: benign ?Extremities: no edema ?Wound: incis healing well ? ?Lab Results: ?Recent Labs  ?  02/17/22 ?[id number]  ?WBC 8.6  ?HGB 9.6*  ?HCT 28.4*  ?PLT 154  ? ?BMET:  ?Recent Labs  ?  02/17/22 ?0137  ?NA 139  ?K 3.6  ?CL 106  ?CO2 27  ?GLUCOSE 98  ?BUN 14  ?CREATININE 0.47  ?CALCIUM 8.1*  ?  ?PT/INR: No results for input(s): LABPROT, INR in the last 72 hours. ?ABG ?   ?Component Value Date/Time  ? PHART 7.316 (L) 02/14/2022 2120  ? HCO3 22.4 02/14/2022 2120  ? TCO2 24 02/14/2022 2120  ? ACIDBASEDEF 3.0 (H) 02/14/2022 2120  ? O2SAT 92 02/14/2022 2120  ? ?CBG (last 3)  ?No results for input(s): GLUCAP in the last 72 hours. ? ?Meds ?Scheduled Meds: ? acetaminophen  1,000 mg Oral Q6H  ? Or  ? acetaminophen (TYLENOL) oral liquid 160 mg/5 mL  1,000 mg Per Tube Q6H  ? aspirin EC  325 mg Oral Daily  ? Or  ? aspirin  324 mg Per Tube Daily  ? atorvastatin  80 mg  Oral Daily  ? bisacodyl  10 mg Oral Daily  ? Or  ? bisacodyl  10 mg Rectal Daily  ? Chlorhexidine Gluconate Cloth  6 each Topical Daily  ? docusate sodium  200 mg Oral Daily  ? furosemide  40 mg Oral BID  ? gabapentin  200 mg Oral TID  ? metoprolol tartrate  12.5 mg Oral BID  ? pantoprazole  40 mg Oral Daily  ? potassium chloride  20 mEq Oral BID  ? sodium chloride flush  10-40 mL Intracatheter Q12H  ? traZODone  50 mg Oral QHS  ? ?Continuous Infusions: ?PRN Meds:.hydrALAZINE, metoprolol tartrate, ondansetron (ZOFRAN) IV, sodium chloride flush, traMADol ? ?Xrays ?No results found. ? ?Assessment/Plan: ?S/P Procedure(s) (LRB): ?CORONARY ARTERY BYPASS GRAFTING (CABG) X THREE BYPASSES. USING LEFT INTERNAL MAMMARY ARTERY & RIGHT GREATER SAPHENOUS VEIN HARVESTED ENDOSCOPICALLY. (N/A) ?TRANSESOPHAGEAL ECHOCARDIOGRAM (TEE) (N/A) ?POD#5 ?1 afeb, VSS w/ s BP 120's-160's, dBP 70's -100's- restart zestoretic- was not in home meds list Renal fxn is normal - will add ?2 conts to need O2 at times- currently would qualify for home O2- documented in nurse note- she does have h/o emphysema, quit smoking 2011, >40 pk hx ?3 voiding well, amt not documented, weight approx at preop  ?4 labs pending ?  5 will stop lasix . If cant get off O2 today will plan to d/c with it tomorrow if remains stable and BP also controlled, ?6 push pulm hygiene and rehab as able ? ? ? ? LOS: 5 days  ? ? ?John Giovanni PA-C pager [phone] ?02/19/2022 ?  ? Chart reviewed, patient examined, agree with above. ?She looks good other than still requiring oxygen. She would like to go home today but I doubt that oxygen be arranged today. ?

## 2022-02-19 NOTE — Plan of Care (Signed)

## 2022-02-19 NOTE — Progress Notes (Signed)
SATURATION QUALIFICATIONS: (This note is used to comply with regulatory documentation for home oxygen) ? ?Patient Saturations on Room Air at Rest = 94%% ? ?Patient Saturations on Room Air while Ambulating = 96%% ? ?Patient Saturations on 0 (zero) Liters of oxygen while Ambulating = 96% ? ?Please briefly explain why patient needs home oxygen: N/A ?

## 2022-02-20 DIAGNOSIS — E278 Other specified disorders of adrenal gland: Secondary | ICD-10-CM | POA: Insufficient documentation

## 2022-02-20 DIAGNOSIS — R0683 Snoring: Secondary | ICD-10-CM | POA: Insufficient documentation

## 2022-02-20 DIAGNOSIS — I358 Other nonrheumatic aortic valve disorders: Secondary | ICD-10-CM | POA: Insufficient documentation

## 2022-02-20 DIAGNOSIS — F330 Major depressive disorder, recurrent, mild: Secondary | ICD-10-CM | POA: Insufficient documentation

## 2022-02-20 DIAGNOSIS — F5101 Primary insomnia: Secondary | ICD-10-CM | POA: Insufficient documentation

## 2022-02-20 DIAGNOSIS — E782 Mixed hyperlipidemia: Secondary | ICD-10-CM | POA: Insufficient documentation

## 2022-02-20 DIAGNOSIS — G2581 Restless legs syndrome: Secondary | ICD-10-CM | POA: Insufficient documentation

## 2022-02-20 DIAGNOSIS — F411 Generalized anxiety disorder: Secondary | ICD-10-CM | POA: Insufficient documentation

## 2022-02-21 LAB — BPAM RBC
Blood Product Expiration Date: 202305192359
Blood Product Expiration Date: 202305192359
Unit Type and Rh: 5100
Unit Type and Rh: 5100

## 2022-02-21 LAB — TYPE AND SCREEN
ABO/RH(D): O POS
Antibody Screen: NEGATIVE
Unit division: 0
Unit division: 0

## 2022-02-23 ENCOUNTER — Encounter: Payer: Self-pay | Admitting: Cardiology

## 2022-02-23 ENCOUNTER — Ambulatory Visit (INDEPENDENT_AMBULATORY_CARE_PROVIDER_SITE_OTHER): Payer: BC Managed Care – PPO | Admitting: Cardiology

## 2022-02-23 VITALS — BP 108/66 | HR 80 | Ht 64.0 in | Wt 187.4 lb

## 2022-02-23 DIAGNOSIS — E782 Mixed hyperlipidemia: Secondary | ICD-10-CM

## 2022-02-23 DIAGNOSIS — I952 Hypotension due to drugs: Secondary | ICD-10-CM | POA: Diagnosis not present

## 2022-02-23 DIAGNOSIS — I2510 Atherosclerotic heart disease of native coronary artery without angina pectoris: Secondary | ICD-10-CM | POA: Diagnosis not present

## 2022-02-23 DIAGNOSIS — I10 Essential (primary) hypertension: Secondary | ICD-10-CM

## 2022-02-23 DIAGNOSIS — J431 Panlobular emphysema: Secondary | ICD-10-CM

## 2022-02-23 NOTE — Progress Notes (Signed)
?Cardiology Office Note:   ? ?Date:  02/23/2022  ? ?ID:  [name], DOB [birth year], MRN [id number] ? ?PCP:  Clancy Gourd, NP  ?Cardiologist:  Shirlee More, MD   ? ?Referring MD: Clancy Gourd, NP  ? ? ?ASSESSMENT:   ? ?1. Coronary artery disease involving native heart without angina pectoris, unspecified vessel or lesion type   ?2. Hypotension due to drugs   ?3. Essential hypertension   ?4. Mixed hyperlipidemia   ?5. Panlobular emphysema ([place])   ? ?PLAN:   ? ?In order of problems listed above: ? ?Fortunately she is seen by me today she has had symptomatic hypotension at home blood pressures less than 80-90 with near syncope organ to go ahead and stop her antihypertensive agents her husband is home all day we will monitor heart rate blood pressure oxygen saturation 70 recordings on Monday.  She is using a smart watch and she has had no bradycardia couple times her sats have been less than 89 but she had only one in the 90s. 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 ? ?Next appointment: 2 to 3 weeks ? ? ?Medication Adjustments/Labs and Tests Ordered: ?Current medicines are reviewed at length with the patient today.  Concerns regarding medicines are outlined above.  ?Orders Placed This Encounter  ?Procedures  ? Lipid Profile  ? Lipoprotein A (LPA)  ? Comp Met (CMET)  ? ?No orders of the defined types were placed in this encounter. ? ? ?Chief Complaint  ?Patient presents with  ? Coronary Artery Disease  ?I had an episode where almost fainted and the blood pressure was a little ? ?History of Present Illness:   ? ?[name] is a 61 y.o. female with a hx of  coronary artery disease last seen by advanced heart failure during her recent hospitalization ?Marland Kitchen ?Compliance with diet, lifestyle and medications: Yes ? ?She was seen by me ?  ?She was previously seen by the other cardiology group at [facility] with CAD and CABG ?Underwent left heart catheterization [facility] 02/08/2022 with multivessel CAD including distal left main stenosis. ?Conclusion ? ?  ?  Mid RCA lesion is 75% stenosed. ?  Mid LAD lesion is 40% stenosed. ?  Mid Cx lesion is 70% stenosed. ?  Dist LM lesion is 50% stenosed. ?  LV end diastolic pressure is normal. ?  ?1.  Multivessel disease consisting of distal left main, left circumflex and right coronary artery.  The arterial tree is diffusely calcified. ?2.  LVEDP of 11 mmHg. ? ?Conclusion ? ?  ?  Mid RCA lesion is 75% stenosed. ?  Mid LAD lesion is 40% stenosed. ?  Mid Cx lesion is 70% stenosed. ?  Dist LM lesion is 50% stenosed. ?  LV end diastolic pressure is normal. ?  ?1.  Multivessel disease consisting of distal left main, left circumflex and right coronary artery.  The arterial tree is diffusely calcified. ?2.  LVEDP of 11 mmHg. ? ?She underwent bypass surgery 02/14/2022 she required IV Lasix for fluid overload and was continued on low-dose beta-blocker was noted she had a significant  history of emphysema with 40+ pack year smoking history having stopped in 2011 but fortunately did not require ambulatory oxygen postdischarge ? ?Operative Report  ?  ?DATE OF PROCEDURE: 02/14/2022 ?  ?OPERATION:    ?1.  Coronary artery bypass grafting x3 (left internal mammary artery to LAD, saphenous vein graft to posterior descending, saphenous vein graft to circumflex marginal). ?2.  Endoscopic harvest of right leg greater saphenous vein. ?  ?PREOPERATIVE DIAGNOSIS:  Significant left main and multivessel coronary artery disease with unstable angina. ?  ?POSTOPERATIVE DIAGNOSIS:  Significant left main and multivessel coronary artery disease with  unstable angina. ?  ?SURGEON:  Len Childs, MD ?  ?ASSISTANT:  Jadene Pierini PA-C ?Past Medical History:  ?Diagnosis Date  ? Adrenal nodule ([place])   ? Aortic heart murmur on examination   ? Chest pressure 11/29/2015  ? Dyspnea on exertion 11/29/2015  ? Essential hypertension 10/14/2015  ? Family history of coronary artery disease 11/29/2015  ? Fatigue 10/14/2015  ? Frequent UTI 10/14/2015  ? Generalized anxiety disorder   ? Insomnia, idiopathic   ? Left lower quadrant pain 10/14/2015  ? Mixed hyperlipidemia   ? Panlobular emphysema ([place]) 11/29/2015  ? 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 Restless leg syndrome   ? Snoring   ? ? ?Past Surgical History:  ?Procedure Laterality Date  ? ABDOMINAL HYSTERECTOMY    ? CHOLECYSTECTOMY    ? CORONARY ARTERY BYPASS GRAFT N/A 02/14/2022  ? Procedure: CORONARY ARTERY BYPASS GRAFTING (CABG) X THREE BYPASSES. USING LEFT INTERNAL MAMMARY ARTERY & RIGHT GREATER SAPHENOUS VEIN HARVESTED ENDOSCOPICALLY.;  Surgeon: Dahlia Byes, MD;  Location: [place];  Service: Open Heart Surgery;  Laterality: N/A;  ? LEFT HEART CATH AND CORONARY ANGIOGRAPHY N/A 02/08/2022  ? Procedure: LEFT HEART CATH AND CORONARY ANGIOGRAPHY;  Surgeon: Early Osmond, MD;  Location: [place] [facility];  Service: Cardiovascular;  Laterality: N/A;  ? TEE WITHOUT CARDIOVERSION N/A 02/14/2022  ? Procedure: TRANSESOPHAGEAL ECHOCARDIOGRAM (TEE);  Surgeon: Dahlia Byes, MD;  Location: [place];  Service: Open Heart Surgery;  Laterality: N/A;  ? ? ?Current Medications: ?Current Meds  ?Medication Sig  ? aspirin EC 325 MG EC tablet Take 1 tablet (325 mg total) by mouth daily.  ? atorvastatin (LIPITOR) 80 MG tablet Take 1 tablet (80 mg total) by mouth daily.  ? gabapentin (NEURONTIN) 100 MG capsule Take 2 capsules (200 mg total) by mouth 3 (three) times daily.  ? metoprolol tartrate (LOPRESSOR) 25 MG tablet Take 0.5 tablets (12.5 mg total) by mouth 2 (two) times daily.  ? Multiple Vitamin  (MULTIVITAMIN WITH MINERALS) TABS tablet Take 1 tablet by mouth daily.  ? Omega-3 Fatty Acids (FISH OIL) 1000 MG CAPS Take 1 capsule by mouth daily.  ? traMADol (ULTRAM) 50 MG tablet Take 1 tablet (50 mg total) by mouth every 6 (six) hours as needed for up to 7 days for moderate pain.  ? traZODone (DESYREL) 100 MG tablet Take 100 mg by mouth at bedtime.  ? [DISCONTINUED] hydrochlorothiazide (HYDRODIURIL) 25 MG tablet Take 1 tablet (25 mg total) by mouth daily.  ? [DISCONTINUED] lisinopril (ZESTRIL) 20 MG tablet Take 1 tablet (20 mg total) by mouth daily.  ?  ? ?Allergies:   Codeine and Bismuth subsalicylate  ? ?Social History  ? ?Socioeconomic History  ? Marital status: Married  ?  Spouse name: Fritz Pickerel  ? Number of children: 2  ? Years of education: Not on file  ? Highest education  level: Not on file  ?Occupational History  ? Not on file  ?Tobacco Use  ? Smoking status: Former  ?  Packs/day: 2.00  ?  Types: Cigarettes  ?  Start date: 92  ?  Quit date: 2011  ?  Years since quitting: 12.3  ?  Passive exposure: Past  ? Smokeless tobacco: Never  ?Vaping Use  ? Vaping Use: Never used  ?Substance and Sexual Activity  ? Alcohol use: Yes  ?  Comment: occasional  ? Drug use: Never  ? Sexual activity: Yes  ?Other Topics Concern  ? Not on file  ?Social History Narrative  ? Not on file  ? ?Social Determinants of Health  ? ?Financial Resource Strain: Not on file  ?Food Insecurity: Not on file  ?Transportation Needs: Not on file  ?Physical Activity: Not on file  ?Stress: Not on file  ?Social Connections: Not on file  ?  ? ?Family History: ?The patient's family history includes Diabetes in her brother and brother; Diabetes type II in her mother; Heart attack in her father and mother; Hyperlipidemia in her mother; Hypertension in her brother, father, and mother; Liver cancer in her father; Stroke in her brother. ?ROS:   ?Please see the history of present illness.    ?All other systems reviewed and are negative. ? ?EKGs/Labs/Other  Studies Reviewed:   ? ?The following studies were reviewed today: ?Recent Labs: ?02/09/2022: ALT 34; TSH 2.619 ?02/15/2022: Magnesium 2.4 ?02/17/2022: Hemoglobin 9.6; Platelets 154 ?02/19/2022: BUN 9; Creatinine, Ser 0

## 2022-02-23 NOTE — Patient Instructions (Signed)
Medication Instructions:  ?Your physician has recommended you make the following change in your medication:  ? ?STOP: Lisinopril ?STOP: Hydrochlorothiazide ? ?*If you need a refill on your cardiac medications before your next appointment, please call your pharmacy* ? ? ?Lab Work: ?Your physician recommends that you return for lab work in:  ? ?Labs in 3 weeks: Lipids, Lpa, CMP ? ?If you have labs (blood work) drawn today and your tests are completely normal, you will receive your results only by: ?MyChart Message (if you have MyChart) OR ?A paper copy in the mail ?If you have any lab test that is abnormal or we need to change your treatment, we will call you to review the results. ? ? ?Testing/Procedures: ?None ? ? ?Follow-Up: ?At [facility], you and your health needs are our priority.  As part of our continuing mission to provide you with exceptional heart care, we have created designated Provider Care Teams.  These Care Teams include your primary Cardiologist (physician) and Advanced Practice Providers (APPs -  Physician Assistants and Nurse Practitioners) who all work together to provide you with the care you need, when you need it. ? ?We recommend signing up for the patient portal called "MyChart".  Sign up information is provided on this After Visit Summary.  MyChart is used to connect with patients for Virtual Visits (Telemedicine).  Patients are able to view lab/test results, encounter notes, upcoming appointments, etc.  Non-urgent messages can be sent to your provider as well.   ?To learn more about what you can do with MyChart, go to [email or web address].   ? ?Your next appointment:   ?2 week(s) ? ?The format for your next appointment:   ?In Person ? ?Provider:   ?Norman Herrlich, MD  ? ? ?Other Instructions ?Set heart rate limits on FitBit to low 40 and High 130 ?Send to MyChart on Monday: heart rate, blood pressure, O2 saturation ? ?Important Information About Sugar ? ? ? ? ? ? ?

## 2022-02-24 ENCOUNTER — Telehealth (HOSPITAL_COMMUNITY): Payer: Self-pay

## 2022-02-24 NOTE — Telephone Encounter (Signed)
Outside referral faxed to [facility]. 

## 2022-02-27 ENCOUNTER — Encounter: Payer: Self-pay | Admitting: Cardiology

## 2022-02-27 DIAGNOSIS — J431 Panlobular emphysema: Secondary | ICD-10-CM

## 2022-03-03 ENCOUNTER — Telehealth: Payer: Self-pay

## 2022-03-03 NOTE — Telephone Encounter (Signed)
Attending Physician statement completed and faxed to [facility] @ [phone] ?Beginning LOA 02/14/22 through 05/15/22. ?STD form from [facility] group completed and faxed to [phone]. ?Forms mailed to patient's home address. ?

## 2022-03-17 ENCOUNTER — Other Ambulatory Visit: Payer: Self-pay | Admitting: Cardiothoracic Surgery

## 2022-03-17 ENCOUNTER — Encounter: Payer: Self-pay | Admitting: Cardiology

## 2022-03-17 ENCOUNTER — Ambulatory Visit (INDEPENDENT_AMBULATORY_CARE_PROVIDER_SITE_OTHER): Payer: BC Managed Care – PPO | Admitting: Cardiology

## 2022-03-17 VITALS — BP 162/98 | HR 76 | Ht 64.0 in | Wt 187.6 lb

## 2022-03-17 DIAGNOSIS — I2510 Atherosclerotic heart disease of native coronary artery without angina pectoris: Secondary | ICD-10-CM | POA: Diagnosis not present

## 2022-03-17 DIAGNOSIS — J431 Panlobular emphysema: Secondary | ICD-10-CM | POA: Diagnosis not present

## 2022-03-17 DIAGNOSIS — E782 Mixed hyperlipidemia: Secondary | ICD-10-CM | POA: Diagnosis not present

## 2022-03-17 DIAGNOSIS — I10 Essential (primary) hypertension: Secondary | ICD-10-CM

## 2022-03-17 DIAGNOSIS — Z951 Presence of aortocoronary bypass graft: Secondary | ICD-10-CM

## 2022-03-17 MED ORDER — METOPROLOL TARTRATE 25 MG PO TABS: 12.5000 mg | ORAL_TABLET | Freq: Two times a day (BID) | ORAL | 3 refills | Status: DC

## 2022-03-17 MED ORDER — OLMESARTAN MEDOXOMIL-HCTZ 20-12.5 MG PO TABS: 1.0000 | ORAL_TABLET | Freq: Every day | ORAL | 3 refills | Status: DC

## 2022-03-17 NOTE — Progress Notes (Signed)
Cardiology Office Note:    Date:  03/17/2022   ID:  [name], DOB 1961-01-14, MRN [id number]  PCP:  Tamsen Snider, NP  Cardiologist:  Norman Herrlich, MD    Referring MD: Tamsen Snider, NP    ASSESSMENT:    1. Essential hypertension   2. Coronary artery disease involving native heart without angina pectoris, unspecified vessel or lesion type   3. Mixed hyperlipidemia   4. Panlobular emphysema (HCC)    PLAN:    In order of problems listed above:  Not uncommonly after bypass surgery there is a period of relative hypotension and then blood pressure returns to his baseline over time I think she needs to restart her combined ARB thiazide diuretic and continue To trend her blood pressure at home.  I suspect she will be able to start cardiac rehab in the next 1 to 2 weeks Stable CAD follow-up with her surgeons Monday I think she is having typical post CABG sternal pain Continue her statin Urged her to see pulmonary as scheduled she has significant COPD   Next appointment: 6 weeks   Medication Adjustments/Labs and Tests Ordered: Current medicines are reviewed at length with the patient today.  Concerns regarding medicines are outlined above.  No orders of the defined types were placed in this encounter.  No orders of the defined types were placed in this encounter.   Chief Complaint  Patient presents with   Follow-up   Hypertension    Blood pressure is consistently elevated and cardiac rehab is on hold   Coronary Artery Disease    History of Present Illness:    [name] is a 61 y.o. female with a hx of CAD and recent CABG 02/14/2022 hypertension hyperlipidemia last seen 02/23/2022.  Echocardiogram prior to bypass surgery showed ejection fraction of 65 to 70%. She was previously seen at [facility] with CAD and CABG Underwent left heart catheterization [facility] 02/08/2022 with multivessel CAD including distal  left main stenosis. Conclusion   Mid RCA lesion is 75% stenosed.   Mid LAD lesion is 40% stenosed.   Mid Cx lesion is 70% stenosed.   Dist LM lesion is 50% stenosed.   LV end diastolic pressure is normal.   1.  Multivessel disease consisting of distal left main, left circumflex and right coronary artery.  The arterial tree is diffusely calcified. 2.  LVEDP of 11 mmHg.   Conclusion   Mid RCA lesion is 75% stenosed.   Mid LAD lesion is 40% stenosed.   Mid Cx lesion is 70% stenosed.   Dist LM lesion is 50% stenosed.   LV end diastolic pressure is normal.   1.  Multivessel disease consisting of distal left main, left circumflex and right coronary artery.  The arterial tree is diffusely calcified. 2.  LVEDP of 11 mmHg.   She underwent bypass surgery 02/14/2022 she required IV Lasix for fluid overload and was continued on low-dose beta-blocker was noted she had a significant history of emphysema with 40+ pack year smoking history having stopped in 2011 but fortunately did not require ambulatory oxygen postdischarge   Operative Report    DATE OF PROCEDURE: 02/14/2022   OPERATION:    1.  Coronary artery bypass grafting x3 (left internal mammary artery to LAD, saphenous vein graft to posterior descending, saphenous vein graft to circumflex marginal). 2.  Endoscopic harvest of right leg greater saphenous vein.   PREOPERATIVE DIAGNOSIS:  Significant left main and  multivessel coronary artery disease with unstable angina.   POSTOPERATIVE DIAGNOSIS:  Significant left main and multivessel coronary artery disease with unstable angina.   SURGEON:  Kerin Perna III, MD  Echo 02/09/2022:  1. Left ventricular ejection fraction, by estimation, is 65 to 70%. The left ventricle has normal function. The left ventricle has no regional wall motion abnormalities. Left ventricular diastolic parameters were  normal.   2. Right ventricular systolic function is normal. The right ventricular size is  normal.   3. The mitral valve is normal in structure. No evidence of mitral valve regurgitation.   4. The aortic valve is normal in structure. Aortic valve regurgitation is trivial.   Compliance with diet, lifestyle and medications: Yes  When seen last time she was having hypotension and her diuretic ACE inhibitor was stopped   Since then her blood pressures consistently running greater than 150 and 160 systolic she went to cardiac rehab and declined to initiate program because of her blood pressure. She has had some episodes of dizziness she attributes to her blood pressure She is not short of breath since spirometry 1750 cc both times her oxygen sats are 88-89 she has an appointment for pulmonary evaluation June She is not having angina pleuritic chest pain leg swelling or edema Past Medical History:  Diagnosis Date   Adrenal nodule (HCC)    Aortic heart murmur on examination    Chest pressure 11/29/2015   Dyspnea on exertion 11/29/2015   Essential hypertension 10/14/2015   Family history of coronary artery disease 11/29/2015   Fatigue 10/14/2015   Frequent UTI 10/14/2015   Generalized anxiety disorder    Insomnia, idiopathic    Left lower quadrant pain 10/14/2015   Mixed hyperlipidemia    Panlobular emphysema (HCC) 11/29/2015   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 USING LEFT INTERNAL MAMMARY ARTERY & RIGHT GREATER SAPHENOUS VEIN HARVESTED ENDOSCOPICALLY.;  Surgeon: Lovett Sox, MD;  Location: [facility] OR;  Service: Open Heart Surgery;  Laterality: N/A;   LEFT HEART CATH AND CORONARY ANGIOGRAPHY N/A 02/08/2022   Procedure: LEFT HEART CATH AND CORONARY ANGIOGRAPHY;  Surgeon: Orbie Pyo, MD;  Location:  [facility] INVAS[facility] CV LAB;  Service: Cardiovascular;  Laterality: N/A;   TEE WITHOUT CARDIOVERSION N/A 02/14/2022   Procedure: TRANSESOPHAGEAL ECHOCARDIOGRAM (TEE);  Surgeon: Lovett Sox, MD;  Location: [facility] OR;  Service: Open Heart Surgery;  Laterality: N/A;    Current Medications: Current Meds  Medication Sig   aspirin EC 325 MG EC tablet Take 1 tablet (325 mg total) by mouth daily.   atorvastatin (LIPITOR) 80 MG tablet Take 1 tablet (80 mg total) by mouth daily.   gabapentin (NEURONTIN) 100 MG capsule Take 2 capsules (200 mg total) by mouth 3 (three) times daily.   metoprolol tartrate (LOPRESSOR) 25 MG tablet Take 0.5 tablets (12.5 mg total) by mouth 2 (two) times daily.   Multiple Vitamin (MULTIVITAMIN WITH MINERALS) TABS tablet Take 1 tablet by mouth daily.   Omega-3 Fatty Acids (FISH OIL) 1000 MG CAPS Take 1 capsule by mouth daily.   traZODone (DESYREL) 100 MG tablet Take 100 mg by mouth at bedtime.     Allergies:   Codeine and Bismuth subsalicylate  Social History   Socioeconomic History   Marital status: Married    Spouse name: Peyton NajjarLarry   Number of children: 2   Years of education: Not on file   Highest education level: Not on file  Occupational History   Not on file  Tobacco Use   Smoking status: Former    Packs/day: 2.00    Types: Cigarettes    Start date: 891977    Quit date: 2011    Years since quitting: 12.3    Passive exposure: Past   Smokeless tobacco: Never  Vaping Use   Vaping Use: Never used  Substance and Sexual Activity   Alcohol use: Yes    Comment: occasional   Drug use: Never   Sexual activity: Yes  Other Topics Concern   Not on file  Social History Narrative   Not on file   Social Determinants of Health   Financial Resource Strain: Not on file  Food Insecurity: Not on file  Transportation Needs: Not on file  Physical Activity: Not on file  Stress: Not on file  Social Connections: Not on file     Family History: The patient's family history  includes Diabetes in her brother and brother; Diabetes type II in her mother; Heart attack in her father and mother; Hyperlipidemia in her mother; Hypertension in her brother, father, and mother; Liver cancer in her father; Stroke in her brother. ROS:   Please see the history of present illness.    All other systems reviewed and are negative.  EKGs/Labs/Other Studies Reviewed:    The following studies were reviewed today:   Recent Labs: 02/09/2022: ALT 34; TSH 2.619 02/15/2022: Magnesium 2.4 02/17/2022: Hemoglobin 9.6; Platelets 154 02/19/2022: BUN 9; Creatinine, Ser 0.48; Potassium 3.5; Sodium 142  Recent Lipid Panel No results found for: CHOL, TRIG, HDL, CHOLHDL, VLDL, LDLCALC, LDLDIRECT  Physical Exam:    VS:  BP (!) 162/98 (BP Location: Right Arm, Patient Position: Sitting)   Pulse 76   Ht 5\' 4"  (1.626 m)   Wt 187 lb 9.6 oz (85.1 kg)   SpO2 95%   BMI 32.20 kg/m     Wt Readings from Last 3 Encounters:  03/17/22 187 lb 9.6 oz (85.1 kg)  02/23/22 187 lb 6.4 oz (85 kg)  02/19/22 191 lb 9.3 oz (86.9 kg)     GEN: She is quite anxious well nourished, well developed in no acute distress HEENT: Normal NECK: No JVD; No carotid bruits LYMPHATICS: No lymphadenopathy CARDIAC: There is no sternal click no pleural pericardial RRR, no murmurs, rubs, gallops RESPIRATORY: Diffusely diminished breath sounds clear to auscultation without rales, wheezing or rhonchi.  She has 2 sutures from her chest tube still being removed   ABDOMEN: Soft, non-tender, non-distended MUSCULOSKELETAL:  No edema; No deformity  SKIN: Warm and dry NEUROLOGIC:  Alert and oriented x 3 PSYCHIATRIC:  Normal affect    Signed, Norman HerrlichBrian Javad Salva, MD  03/17/2022 8:16 AM    [facility]

## 2022-03-17 NOTE — Patient Instructions (Signed)
Medication Instructions:  Your physician has recommended you make the following change in your medication:   START: Benicar/HCT 20/12.5 one tablet daily  *If you need a refill on your cardiac medications before your next appointment, please call your pharmacy*   Lab Work: None If you have labs (blood work) drawn today and your tests are completely normal, you will receive your results only by: [place] (if you have MyChart) OR A paper copy in the mail If you have any lab test that is abnormal or we need to change your treatment, we will call you to review the results.   Testing/Procedures: None   Follow-Up: At [facility], you and your health needs are our priority.  As part of our continuing mission to provide you with exceptional heart care, we have created designated Provider Care Teams.  These Care Teams include your primary Cardiologist (physician) and Advanced Practice Providers (APPs -  Physician Assistants and Nurse Practitioners) who all work together to provide you with the care you need, when you need it.  We recommend signing up for the patient portal called "MyChart".  Sign up information is provided on this After Visit Summary.  MyChart is used to connect with patients for Virtual Visits (Telemedicine).  Patients are able to view lab/test results, encounter notes, upcoming appointments, etc.  Non-urgent messages can be sent to your provider as well.   To learn more about what you can do with MyChart, go to [email or web address].    Your next appointment:   6 week(s)  The format for your next appointment:   In Person  Provider:   Shirlee More, MD    Other Instructions None  Important Information About Sugar          [facility]  Tips to measure your blood pressure correctly  To determine whether you have hypertension, a medical professional will take a blood pressure reading. How you prepare for the test, the position of your arm, and other  factors can change a blood pressure reading by 10% or more. That could be enough to hide high blood pressure, start you on a drug you don't really need, or lead your doctor to incorrectly adjust your medications. National and international guidelines offer specific instructions for measuring blood pressure. If a doctor, nurse, or medical assistant isn't doing it right, don't hesitate to ask him or her to get with the guidelines. Here's what you can do to ensure a correct reading:  Don't drink a caffeinated beverage or smoke during the 30 minutes before the test.  Sit quietly for five minutes before the test begins.  During the measurement, sit in a chair with your feet on the floor and your arm supported so your elbow is at about heart level.  The inflatable part of the cuff should completely cover at least 80% of your upper arm, and the cuff should be placed on bare skin, not over a shirt.  Don't talk during the measurement.  Have your blood pressure measured twice, with a brief break in between. If the readings are different by 5 points or more, have it done a third time. There are times to break these rules. If you sometimes feel lightheaded when getting out of bed in the morning or when you stand after sitting, you should have your blood pressure checked while seated and then while standing to see if it falls from one position to the next. Because blood pressure varies throughout the day, your doctor will rarely diagnose  hypertension on the basis of a single reading. Instead, he or she will want to confirm the measurements on at least two occasions, usually within a few weeks of one another. The exception to this rule is if you have a blood pressure reading of 180/110 mm Hg or higher. A result this high usually calls for prompt treatment. It's also a good idea to have your blood pressure measured in both arms at least once, since the reading in one arm (usually the right) may be higher than that in  the left. A 2014 study in The American Journal of Medicine of nearly 3,400 people found average arm- to-arm differences in systolic blood pressure of about 5 points. The higher number should be used to make treatment decisions. In 2017, new guidelines from the [place], the [employer] of Cardiology, and nine other health organizations lowered the diagnosis of high blood pressure to 130/80 mm Hg or higher for all adults. The guidelines also redefined the various blood pressure categories to now include normal, elevated, Stage 1 hypertension, Stage 2 hypertension, and hypertensive crisis (see "Blood pressure categories"). Blood pressure categories  Blood pressure category SYSTOLIC (upper number)  DIASTOLIC (lower number)  Normal Less than 120 mm Hg and Less than 80 mm Hg  Elevated 120-129 mm Hg and Less than 80 mm Hg  High blood pressure: Stage 1 hypertension 130-139 mm Hg or 80-89 mm Hg  High blood pressure: Stage 2 hypertension 140 mm Hg or higher or 90 mm Hg or higher  Hypertensive crisis (consult your doctor immediately) Higher than 180 mm Hg and/or Higher than 120 mm Hg  Source: American Heart Association and American Stroke Association. For more on getting your blood pressure under control, buy Controlling Your Blood Pressure, a Special Health Report from [facility].

## 2022-03-18 LAB — COMPREHENSIVE METABOLIC PANEL
ALT: 20 IU/L (ref 0–32)
AST: 19 IU/L (ref 0–40)
Albumin/Globulin Ratio: 2 (ref 1.2–2.2)
Albumin: 4.6 g/dL (ref 3.8–4.9)
Alkaline Phosphatase: 92 IU/L (ref 44–121)
BUN/Creatinine Ratio: 28 (ref 12–28)
BUN: 15 mg/dL (ref 8–27)
Bilirubin Total: 0.3 mg/dL (ref 0.0–1.2)
CO2: 23 mmol/L (ref 20–29)
Calcium: 9.6 mg/dL (ref 8.7–10.3)
Chloride: 108 mmol/L — ABNORMAL HIGH (ref 96–106)
Creatinine, Ser: 0.53 mg/dL — ABNORMAL LOW (ref 0.57–1.00)
Globulin, Total: 2.3 g/dL (ref 1.5–4.5)
Glucose: 103 mg/dL — ABNORMAL HIGH (ref 70–99)
Potassium: 4.4 mmol/L (ref 3.5–5.2)
Sodium: 146 mmol/L — ABNORMAL HIGH (ref 134–144)
Total Protein: 6.9 g/dL (ref 6.0–8.5)
eGFR: 106 mL/min/{1.73_m2} (ref >59)

## 2022-03-18 LAB — LIPOPROTEIN A (LPA): Lipoprotein (a): 11.7 nmol/L (ref <75.0)

## 2022-03-18 LAB — LIPID PANEL
Chol/HDL Ratio: 3.7 ratio (ref 0.0–4.4)
Cholesterol, Total: 118 mg/dL (ref 100–199)
HDL: 32 mg/dL — ABNORMAL LOW (ref >39)
LDL Chol Calc (NIH): 64 mg/dL (ref 0–99)
Triglycerides: 120 mg/dL (ref 0–149)
VLDL Cholesterol Cal: 22 mg/dL (ref 5–40)

## 2022-03-20 ENCOUNTER — Ambulatory Visit
Admission: RE | Admit: 2022-03-20 | Discharge: 2022-03-20 | Disposition: A | Discharge status: Home / Self Care | Payer: BC Managed Care – PPO | Source: Ambulatory Visit | Attending: Cardiothoracic Surgery | Admitting: Cardiothoracic Surgery

## 2022-03-20 ENCOUNTER — Ambulatory Visit (INDEPENDENT_AMBULATORY_CARE_PROVIDER_SITE_OTHER): Payer: Self-pay | Admitting: Cardiothoracic Surgery

## 2022-03-20 ENCOUNTER — Encounter: Payer: Self-pay | Admitting: Cardiothoracic Surgery

## 2022-03-20 VITALS — BP 146/90 | HR 76 | Resp 20 | Ht 64.0 in | Wt 185.0 lb

## 2022-03-20 DIAGNOSIS — Z951 Presence of aortocoronary bypass graft: Secondary | ICD-10-CM

## 2022-03-20 MED ORDER — GABAPENTIN 100 MG PO CAPS: 200.0000 mg | ORAL_CAPSULE | Freq: Three times a day (TID) | ORAL | 1 refills | Status: DC

## 2022-03-20 NOTE — Progress Notes (Signed)
HPI The patient returns for 1 month follow-up after undergoing CABG x3 for left main stenosis and three-vessel CAD.  She had underlying COPD but did not require oxygen at the time of discharge.  She is remained in sinus rhythm.  Her angina has not recurred but she does have some neuropathic pain at the site of the surgical incisions in her chest and leg.  Gabapentin has been alleviating these symptoms and we will continue with the refill.  Her blood pressure has increased and she is now on an ARB/thiazide by her cardiologist. Patient had trouble starting cardiac rehab at [facility] due to a drop in her oxygen saturation on room air.  She has an appointment to be seen by pulmonary.  I have encouraged her to keep disciplined with her own daily walking program of 20 to 30 minutes.  She is encouraged to use her incentive spirometer if her saturations drop less than 90%.  She understands that thoracic compliance following sternotomy is significantly decreased until healing takes place and that with underlying COPD her borderline oxygen saturations should improve with time..  Chest x-ray today shows no pleural effusions or interstitial edema.  Sternal wires are intact and cardiac silhouette is stable.  Current Outpatient Medications  Medication Sig Dispense Refill   aspirin EC 325 MG EC tablet Take 1 tablet (325 mg total) by mouth daily.     atorvastatin (LIPITOR) 80 MG tablet Take 1 tablet (80 mg total) by mouth daily. 30 tablet 3   metoprolol tartrate (LOPRESSOR) 25 MG tablet Take 0.5 tablets (12.5 mg total) by mouth 2 (two) times daily. 90 tablet 3   Multiple Vitamin (MULTIVITAMIN WITH MINERALS) TABS tablet Take 1 tablet by mouth daily.     olmesartan-hydrochlorothiazide (BENICAR HCT) 20-12.5 MG tablet Take 1 tablet by mouth daily. 90 tablet 3   Omega-3 Fatty Acids (FISH OIL) 1000 MG CAPS Take 1 capsule by mouth daily.     traZODone (DESYREL) 100 MG tablet Take 100 mg by mouth at bedtime.      gabapentin (NEURONTIN) 100 MG capsule Take 2 capsules (200 mg total) by mouth 3 (three) times daily. 90 capsule 1   No current facility-administered medications for this visit.     Review of Systems: Incisions healing well No pedal edema No sternal click or pop sensations No palpitations Weight starting to increase with appetite  Physical Exam Vitals:   03/20/22 1505  BP: (!) 146/90  Pulse: 76  Resp: 20  SpO2: 91%         Exam    General- alert and comfortable.  Sternal incision well-healed.    Neck- no JVD, no cervical adenopathy palpable, no carotid bruit   Lungs- clear without rales, wheezes   Cor- regular rate and rhythm, no murmur , gallop   Abdomen- soft, non-tender   Extremities - warm, non-tender, minimal edema   Neuro- oriented, appropriate, no focal weakness    Diagnostic Tests: Chest x-ray today is clear.  Sternal wires intact.   Impression: Doing well 1 month postop CABG for left main stenosis. She is encouraged to walk 20-30 minutes daily and to follow-up with pulmonary for her chronic lung disease. She can now lift up to 10 pounds and can drive short distances until her next visit. I plan on having her return in 8 weeks to review her progress. I have renewed her gabapentin prescription for the neuropathic pain in her surgical incisions.  Plan:  Continue heart healthy diet and lifestyle. Continue daily  20 to 30-minute walk to optimize her recovery. Return in 8 weeks for review of progress.  Lovett Sox, MD [facility] [phone]

## 2022-04-18 ENCOUNTER — Ambulatory Visit (INDEPENDENT_AMBULATORY_CARE_PROVIDER_SITE_OTHER): Payer: BC Managed Care – PPO | Admitting: Pulmonary Disease

## 2022-04-18 ENCOUNTER — Encounter: Payer: Self-pay | Admitting: Pulmonary Disease

## 2022-04-18 VITALS — BP 118/60 | HR 77 | Temp 98.1°F | Ht 64.0 in | Wt 189.0 lb

## 2022-04-18 DIAGNOSIS — R0609 Other forms of dyspnea: Secondary | ICD-10-CM | POA: Diagnosis not present

## 2022-04-18 DIAGNOSIS — R0602 Shortness of breath: Secondary | ICD-10-CM | POA: Diagnosis not present

## 2022-04-18 DIAGNOSIS — J431 Panlobular emphysema: Secondary | ICD-10-CM

## 2022-04-18 NOTE — Progress Notes (Signed)
[address] [name]    [id number]    16-Sep-1961  Primary Care Physician:Ferrell, Buford Dresser, NP  Referring Physician: Tamsen Snider, NP [address] [place],  [place] [address]  Chief complaint: Consult for COPD, hypoxia  HPI: 61 y.o. with history of coronary artery disease, hypertension, emphysema Referred for evaluation of dyspnea, desats on exertion at rehab She recently had a heart catheterization with triple-vessel disease and underwent CABG in April 2023.  Post CABG she attended rehab and was noted to have desats which improved over time.  Not currently on any inhalers.  No cough, sputum production, dyspnea  Pets: Dog, cat Occupation: Armed forces technical officer at [employer] Exposures: No mold, hot tub, Jacuzzi.  No feather pillows or comforters Smoking history: 40-pack-year smoker.  Quit in 2011 Travel history: No significant travel history Relevant family history: No family history of lung disease  Outpatient Encounter Medications as of 04/18/2022  Medication Sig   aspirin EC 325 MG EC tablet Take 1 tablet (325 mg total) by mouth daily.   atorvastatin (LIPITOR) 80 MG tablet Take 1 tablet (80 mg total) by mouth daily.   gabapentin (NEURONTIN) 100 MG capsule Take 2 capsules (200 mg total) by mouth 3 (three) times daily.   metoprolol tartrate (LOPRESSOR) 25 MG tablet Take 0.5 tablets (12.5 mg total) by mouth 2 (two) times daily.   Multiple Vitamin (MULTIVITAMIN WITH MINERALS) TABS tablet Take 1 tablet by mouth daily.   olmesartan-hydrochlorothiazide (BENICAR HCT) 20-12.5 MG tablet Take 1 tablet by mouth daily.   Omega-3 Fatty Acids (FISH OIL) 1000 MG CAPS Take 1 capsule by mouth daily.   traZODone (DESYREL) 100 MG tablet Take 100 mg by mouth at bedtime.   No facility-administered encounter medications on file as of 04/18/2022.    Allergies as of 04/18/2022 - Review Complete 04/18/2022  Allergen Reaction Noted   Codeine Anaphylaxis 10/14/2015    Bismuth subsalicylate Nausea Only 01/25/2022    Past Medical History:  Diagnosis Date   Adrenal nodule (HCC)    Aortic heart murmur on examination    Chest pressure 11/29/2015   Dyspnea on exertion 11/29/2015   Essential hypertension 10/14/2015   Family history of coronary artery disease 11/29/2015   Fatigue 10/14/2015   Frequent UTI 10/14/2015   Generalized anxiety disorder    Insomnia, idiopathic    Left lower quadrant pain 10/14/2015   Mixed hyperlipidemia    Panlobular emphysema (HCC) 11/29/2015   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 USING LEFT INTERNAL MAMMARY ARTERY & RIGHT GREATER SAPHENOUS VEIN HARVESTED ENDOSCOPICALLY.;  Surgeon: Lovett Sox, MD;  Location: [facility];  Service: Open Heart Surgery;  Laterality: N/A;   LEFT HEART CATH AND CORONARY ANGIOGRAPHY N/A 02/08/2022   Procedure: LEFT HEART CATH AND CORONARY ANGIOGRAPHY;  Surgeon: Orbie Pyo, MD;  Location: [facility];  Service: Cardiovascular;  Laterality: N/A;   TEE WITHOUT CARDIOVERSION N/A 02/14/2022   Procedure: TRANSESOPHAGEAL ECHOCARDIOGRAM (TEE);  Surgeon: Lovett Sox, MD;  Location: [facility] OR;  Service: Open Heart Surgery;  Laterality: N/A;    Family History  Problem Relation Age of Onset   Diabetes type II Mother  Hypertension Mother    Hyperlipidemia Mother    Heart attack Mother    Liver cancer Father    Hypertension Father    Heart attack Father    Diabetes Brother    Hypertension Brother    Stroke Brother    Diabetes Brother     Social History   Socioeconomic History   Marital status: Married    Spouse name: Peyton Najjar   Number of children: 2   Years of education: Not on file   Highest education level:  Not on file  Occupational History   Not on file  Tobacco Use   Smoking status: Former    Packs/day: 2.00    Types: Cigarettes    Start date: 57    Quit date: 2011    Years since quitting: 12.4    Passive exposure: Past   Smokeless tobacco: Never  Vaping Use   Vaping Use: Never used  Substance and Sexual Activity   Alcohol use: Yes    Comment: occasional   Drug use: Never   Sexual activity: Yes  Other Topics Concern   Not on file  Social History Narrative   Not on file   Social Determinants of Health   Financial Resource Strain: Not on file  Food Insecurity: Not on file  Transportation Needs: Not on file  Physical Activity: Not on file  Stress: Not on file  Social Connections: Not on file  Intimate Partner Violence: Not on file    Review of systems: Review of Systems  Constitutional: Negative for fever and chills.  HENT: Negative.   Eyes: Negative for blurred vision.  Respiratory: as per HPI  Cardiovascular: Negative for chest pain and palpitations.  Gastrointestinal: Negative for vomiting, diarrhea, blood per rectum. Genitourinary: Negative for dysuria, urgency, frequency and hematuria.  Musculoskeletal: Negative for myalgias, back pain and joint pain.  Skin: Negative for itching and rash.  Neurological: Negative for dizziness, tremors, focal weakness, seizures and loss of consciousness.  Endo/Heme/Allergies: Negative for environmental allergies.  Psychiatric/Behavioral: Negative for depression, suicidal ideas and hallucinations.  All other systems reviewed and are negative.  Physical Exam: Blood pressure 118/60, pulse 77, temperature 98.1 F (36.7 C), temperature source Oral, height 5\' 4"  (1.626 m), weight 189 lb (85.7 kg), SpO2 97 %. Gen:      No acute distress HEENT:  EOMI, sclera anicteric Neck:     No masses; no thyromegaly Lungs:    Clear to auscultation bilaterally; normal respiratory effort CV:         Regular rate and rhythm; no murmurs Abd:       + bowel sounds; soft, non-tender; no palpable masses, no distension Ext:    No edema; adequate peripheral perfusion Skin:      Warm and dry; no rash Neuro: alert and oriented x 3 Psych: normal mood and affect  Data Reviewed: Imaging: CTA 12/01/2021 Emphysematous changes, no PE, bibasal atelectasis  CT abdomen 01/11/2022 Mild dependent subsegmental atelectasis. I have reviewed the images personally  PFTs: 02/13/2022 FVC 3.98 [120%], FEV1 3.11 [121%], F/F 78, TLC 6.84 [135%], DLCO 20.62 [101%] Hyperinflation and air trapping  Labs:  Assessment:  Emphysema No significant obstruction noted on PFTs.  No need for inhaler therapy at present She had desats post CABG at rehab which I suspect is secondary to postoperative atelectasis.  She has since recovered and was walked in clinic today with no desats noted  His CT does show mild changes at the base which appear to be secondary to  atelectasis.  Get high-res CT to make sure there is no underlying interstitial lung disease at the base.  Plan/Recommendations: High-resolution CT  Chilton Greathouse MD [facility] 04/18/2022, 3:19 PM  CC: Tamsen Snider, NP

## 2022-04-18 NOTE — Patient Instructions (Signed)
I have reviewed your lung function test which are normal lung capacity which is good news We get a hydrogen CT for better evaluation of her lungs Follow-up in clinic in 1 to 2 months for review of scan results.

## 2022-04-20 NOTE — Progress Notes (Deleted)
Cardiology Office Note:    Date:  04/20/2022   ID:  [name], [place] 10/14/1961, MRN [id number]  PCP:  Tamsen Snider, NP  Cardiologist:  Norman Herrlich, MD    Referring MD: Tamsen Snider, NP    ASSESSMENT:    No diagnosis found. PLAN:    In order of problems listed above:  ***   Next appointment: ***   Medication Adjustments/Labs and Tests Ordered: Current medicines are reviewed at length with the patient today.  Concerns regarding medicines are outlined above.  No orders of the defined types were placed in this encounter.  No orders of the defined types were placed in this encounter.   No chief complaint on file.   History of Present Illness:    [name] is a 61 y.o. female with a hx of CAD and CABG 0 14 2023 hypertension hyperlipidemia last seen 12 03/17/2022 with poorly controlled hypertension restarted on ARB thiazide diuretic combination and referred to cardiac rehabilitation as well as pulmonary with her significant emphysema.  Operative Report    DATE OF PROCEDURE: 02/14/2022   OPERATION:    1.  Coronary artery bypass grafting x3 (left internal mammary artery to LAD, saphenous vein graft to posterior descending, saphenous vein graft to circumflex marginal). 2.  Endoscopic harvest of right leg greater saphenous vein.   PREOPERATIVE DIAGNOSIS:  Significant left main and multivessel coronary artery disease with unstable angina.   POSTOPERATIVE DIAGNOSIS:  Significant left main and multivessel coronary artery disease with unstable angina.   SURGEON:  Kerin Perna III, MD   Echo 02/09/2022:  1. Left ventricular ejection fraction, by estimation, is 65 to 70%. The left ventricle has normal function. The left ventricle has no regional wall motion abnormalities. Left ventricular diastolic parameters were  normal.   2. Right ventricular systolic function is normal. The right ventricular size is normal.   3. The mitral  valve is normal in structure. No evidence of mitral valve regurgitation.   4. The aortic valve is normal in structure. Aortic valve regurgitation is trivial.    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 USING LEFT INTERNAL MAMMARY ARTERY & RIGHT GREATER SAPHENOUS VEIN HARVESTED ENDOSCOPICALLY.;  Surgeon: Lovett Sox, MD;  Location: [facility] OR;  Service: Open Heart Surgery;  Laterality: N/A;   LEFT HEART CATH AND CORONARY ANGIOGRAPHY N/A 02/08/2022   Procedure: LEFT HEART CATH AND CORONARY ANGIOGRAPHY;  Surgeon: Orbie Pyo, MD;  Location: [facility];  Service: Cardiovascular;  Laterality: N/A;   TEE WITHOUT CARDIOVERSION N/A 02/14/2022   Procedure: TRANSESOPHAGEAL ECHOCARDIOGRAM (TEE);  Surgeon: Lovett Sox, MD;  Location: [facility]  OR;  Service: Open Heart Surgery;  Laterality: N/A;    Current Medications: No outpatient medications have been marked as taking for the 04/21/22 encounter (Appointment) with Baldo Daub, MD.     Allergies:   Codeine and Bismuth subsalicylate   Social History   Socioeconomic History   Marital status: Married    Spouse name: Peyton Najjar   Number of children: 2   Years of  education: Not on file   Highest education level: Not on file  Occupational History   Not on file  Tobacco Use   Smoking status: Former    Packs/day: 2.00    Types: Cigarettes    Start date: 57    Quit date: 2011    Years since quitting: 12.4    Passive exposure: Past   Smokeless tobacco: Never  Vaping Use   Vaping Use: Never used  Substance and Sexual Activity   Alcohol use: Yes    Comment: occasional   Drug use: Never   Sexual activity: Yes  Other Topics Concern   Not on file  Social History Narrative   Not on file   Social Determinants of Health   Financial Resource Strain: Not on file  Food Insecurity: Not on file  Transportation Needs: Not on file  Physical Activity: Not on file  Stress: Not on file  Social Connections: Not on file     Family History: The patient's ***family history includes Diabetes in her brother and brother; Diabetes type II in her mother; Heart attack in her father and mother; Hyperlipidemia in her mother; Hypertension in her brother, father, and mother; Liver cancer in her father; Stroke in her brother. ROS:   Please see the history of present illness.    All other systems reviewed and are negative.  EKGs/Labs/Other Studies Reviewed:    The following studies were reviewed today:  EKG:  EKG ordered today and personally reviewed.  The ekg ordered today demonstrates ***  Recent Labs: 02/09/2022: TSH 2.619 02/15/2022: Magnesium 2.4 02/17/2022: Hemoglobin 9.6; Platelets 154 03/17/2022: ALT 20; BUN 15; Creatinine, Ser 0.53; Potassium 4.4; Sodium 146  Recent Lipid Panel    Component Value Date/Time   CHOL 118 03/17/2022 0830   TRIG 120 03/17/2022 0830   HDL 32 (L) 03/17/2022 0830   CHOLHDL 3.7 03/17/2022 0830   LDLCALC 64 03/17/2022 0830    Physical Exam:    VS:  There were no vitals taken for this visit.    Wt Readings from Last 3 Encounters:  04/18/22 189 lb (85.7 kg)  03/20/22 185 lb (83.9 kg)  03/17/22 187 lb 9.6 oz (85.1 kg)      GEN: *** Well nourished, well developed in no acute distress HEENT: Normal NECK: No JVD; No carotid bruits LYMPHATICS: No lymphadenopathy CARDIAC: ***RRR, no murmurs, rubs, gallops RESPIRATORY:  Clear to auscultation without rales, wheezing or rhonchi  ABDOMEN: Soft, non-tender, non-distended MUSCULOSKELETAL:  No edema; No deformity  SKIN: Warm and dry NEUROLOGIC:  Alert and oriented x 3 PSYCHIATRIC:  Normal affect    Signed, Norman Herrlich, MD  04/20/2022 5:34 PM    [facility]

## 2022-04-21 ENCOUNTER — Ambulatory Visit: Payer: BC Managed Care – PPO | Admitting: Cardiology

## 2022-05-05 ENCOUNTER — Other Ambulatory Visit: Payer: Self-pay | Admitting: Cardiothoracic Surgery

## 2022-05-15 ENCOUNTER — Encounter: Payer: Self-pay | Admitting: Cardiothoracic Surgery

## 2022-05-15 ENCOUNTER — Ambulatory Visit (INDEPENDENT_AMBULATORY_CARE_PROVIDER_SITE_OTHER): Payer: Self-pay | Admitting: Cardiothoracic Surgery

## 2022-05-15 VITALS — BP 134/86 | HR 79 | Resp 20 | Ht 64.0 in | Wt 196.4 lb

## 2022-05-15 DIAGNOSIS — R0789 Other chest pain: Secondary | ICD-10-CM

## 2022-05-15 DIAGNOSIS — Z09 Encounter for follow-up examination after completed treatment for conditions other than malignant neoplasm: Secondary | ICD-10-CM

## 2022-05-15 DIAGNOSIS — Z951 Presence of aortocoronary bypass graft: Secondary | ICD-10-CM

## 2022-05-15 HISTORY — DX: Other chest pain: R07.89

## 2022-05-15 HISTORY — DX: Encounter for follow-up examination after completed treatment for conditions other than malignant neoplasm: Z09

## 2022-05-15 MED ORDER — POTASSIUM CHLORIDE CRYS ER 10 MEQ PO TBCR: 10.0000 meq | EXTENDED_RELEASE_TABLET | Freq: Every day | ORAL | 1 refills | Status: DC

## 2022-05-15 MED ORDER — GABAPENTIN 100 MG PO CAPS: 200.0000 mg | ORAL_CAPSULE | Freq: Three times a day (TID) | ORAL | 1 refills | Status: DC

## 2022-05-15 MED ORDER — FUROSEMIDE 40 MG PO TABS: 40.0000 mg | ORAL_TABLET | Freq: Every day | ORAL | 0 refills | Status: DC

## 2022-05-15 NOTE — Progress Notes (Signed)
HPI: Patient presents for final follow-up 3 months after urgent CABG x3.  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  Since her last visit she had a CT scan performed at [facility] which showed no evidence of pulmonary emboli pleural effusion or airspace disease.  She does have emphysema and some expected mild atherosclerotic disease of her aorta. She states she has trouble ambulating or exerting much energy because of shortness of breath and incisional neuropathic pain.  She has an appointment to be seen by her pulmonologist soon.  She has been in a sinus rhythm.  Surgical incisions are all well-healed.  Current Outpatient Medications  Medication Sig Dispense Refill   aspirin EC 325 MG EC tablet Take 1 tablet (325 mg total) by mouth daily.     atorvastatin (LIPITOR) 80 MG tablet Take 1 tablet (80 mg total) by mouth daily. 30 tablet 3   gabapentin (NEURONTIN) 100 MG capsule TAKE 2 CAPSULES BY MOUTH THREE TIMES DAILY 90 capsule 0   metoprolol tartrate (LOPRESSOR) 25 MG tablet Take 0.5 tablets (12.5 mg total) by mouth 2 (two) times daily. 90 tablet 3   Multiple Vitamin (MULTIVITAMIN WITH MINERALS) TABS tablet Take 1 tablet by mouth daily.     olmesartan-hydrochlorothiazide (BENICAR HCT) 20-12.5 MG tablet Take 1 tablet by mouth daily. 90 tablet 3   Omega-3 Fatty Acids (FISH OIL) 1000 MG CAPS Take 1 capsule by mouth daily.     traZODone (DESYREL) 100 MG tablet Take 100 mg by mouth at bedtime.     No current facility-administered medications for this visit.      Physical Exam: Vitals:   05/15/22 1306  BP: 134/86  Pulse: 79  Resp: 20  SpO2: 93%   Anxious but alert and responsive Lungs clear but distant breath sounds Heart rhythm regular no murmur Sternal incision well-healed without instability or clicking or popping Mild edema of her  feet and hands Neuro without focal deficit  Diagnostic Tests: No studies performed today  Impression: Her overall functional recovery is still delayed following multivessel CABG due to incisional issues, preoperative pulmonary disease and inability to enter the formal post CABG cardiac rehab program at [facility].  I expect she will need 6-12 more weeks before she would be able to perform her duties at work.   Plan: Return for review of progress in 6 weeks. Renew gabapentin prescription for neuropathic discomfort 14-day course of Lasix for generalized swelling   Lovett Sox, MD [facility] [phone]

## 2022-05-18 ENCOUNTER — Encounter: Payer: Self-pay | Admitting: Pulmonary Disease

## 2022-05-18 NOTE — Progress Notes (Signed)
Called and spoke with Patient.  Dr. Mannam's results and recommendations given. Understanding stated. Nothing further at this time.  ?

## 2022-05-18 NOTE — Progress Notes (Signed)
CT scan at [facility] 04/25/22 reviewed No acute airspace disease or interstitial lung disease, postsurgical changes of CABG, aortic atherosclerosis and emphysema.  I reviewed the images personally.  Please let patient know that CT does not show any interstitial changes such as scarring or inflammation but does show emphysema as we know from before scans.  I will review in detail at time of clinic visit.  Chilton Greathouse MD [facility] 05/18/2022, 4:46 PM

## 2022-05-23 NOTE — Progress Notes (Unsigned)
Cardiology Office Note:    Date:  05/24/2022   ID:  [name], DOB 07-Jul-1961, MRN [id number]  PCP:  Tamsen Snider, NP  Cardiologist:  Norman Herrlich, MD    Referring MD: Tamsen Snider, NP    ASSESSMENT:    1. SOB (shortness of breath)   2. Panlobular emphysema (HCC)   3. Coronary artery disease involving native heart without angina pectoris, unspecified vessel or lesion type   4. Essential hypertension   5. Mixed hyperlipidemia    PLAN:    In order of problems listed above:  Her clinical problem now is shortness of breath that seems to be disproportionate to her CAD and her underlying emphysema she certainly is at risk for heart failure check proBNP level echocardiogram continue her diuretic and reassess afterwards.  Other considerations include that her COPD and indeed is the mechanism of her shortness of breath and she has arrangements to follow-up with pulmonary consultant and even consideration of early graft occlusion I told her we need to reconsider if she does not have evidence of heart failure and is unimproved.  Prior to surgery her primary problem is exertional shortness of breath anginal equivalent symptoms. Continue her current medical treatment following bypass surgery and follow-up with CT surgery Blood pressure is improved she can continue her ARB thiazide diuretic Continue her statin I asked her to reduce her gabapentin to 100 mg twice daily times it can cause profound sodium retention   Next appointment: 1 month   Medication Adjustments/Labs and Tests Ordered: Current medicines are reviewed at length with the patient today.  Concerns regarding medicines are outlined above.  No orders of the defined types were placed in this encounter.  No orders of the defined types were placed in this encounter.   Chief Complaint  Patient presents with   Follow-up   Shortness of Breath   Leg Swelling    History of Present Illness:     [name] is a 61 y.o. female with a hx of CAD and recent CABG 02/14/2022 hypertension hyperlipidemia.  She was last seen 03/17/2022 with hypertension restarting her ARB and thiazide diuretic and referred to pulmonary with her COPD and emphysema.    Echocardiogram prior to bypass surgery showed ejection fraction of 65 to 70%.  Underwent left heart catheterization [facility] 02/08/2022 with multivessel CAD including distal left main stenosis. Conclusion   Mid RCA lesion is 75% stenosed.   Mid LAD lesion is 40% stenosed.   Mid Cx lesion is 70% stenosed.   Dist LM lesion is 50% stenosed.   LV end diastolic pressure is normal.   1.  Multivessel disease consisting of distal left main, left circumflex and right coronary artery.  The arterial tree is diffusely calcified. 2.  LVEDP of 11 mmHg.   Conclusion   Mid RCA lesion is 75% stenosed.   Mid LAD lesion is 40% stenosed.   Mid Cx lesion is 70% stenosed.   Dist LM lesion is 50% stenosed.   LV end diastolic pressure is normal.   1.  Multivessel disease consisting of distal left main, left circumflex and right coronary artery.  The arterial tree is diffusely calcified. 2.  LVEDP of 11 mmHg.   She underwent bypass surgery 02/14/2022 she required IV Lasix for fluid overload and was continued on low-dose beta-blocker was noted she had a significant history of emphysema with 40+ pack year smoking history having stopped in 2011 but fortunately did not  require ambulatory oxygen postdischarge   Operative Report    DATE OF PROCEDURE: 02/14/2022   OPERATION:    1.  Coronary artery bypass grafting x3 (left internal mammary artery to LAD, saphenous vein graft to posterior descending, saphenous vein graft to circumflex marginal). 2.  Endoscopic harvest of right leg greater saphenous vein.   PREOPERATIVE DIAGNOSIS:  Significant left main and multivessel coronary artery disease with unstable angina.   POSTOPERATIVE  DIAGNOSIS:  Significant left main and multivessel coronary artery disease with unstable angina.   SURGEON:  Kerin Perna III, MD   Echo 02/09/2022:  1. Left ventricular ejection fraction, by estimation, is 65 to 70%. The left ventricle has normal function. The left ventricle has no regional wall motion abnormalities. Left ventricular diastolic parameters were  normal.   2. Right ventricular systolic function is normal. The right ventricular size is normal.   3. The mitral valve is normal in structure. No evidence of mitral valve regurgitation.   4. The aortic valve is normal in structure. Aortic valve regurgitation is trivial.   Compliance with diet, lifestyle and medications: Yes  Is a very complex visit she had bypass surgery in April she will be feeling better but has marked exercise intolerance she is short of breath even with ADLs and the activities more than walking indoors Postoperatively she had fluid retention and needed a loop diuretic recently she had peripheral edema and was started back on a diuretic by CT surgery does not seem to be effective she still has edema She had postoperative pain and takes gabapentin which can cause intense sodium retention She has symptoms suggestive of heart failure with orthopnea and peripheral edema. She has emphysema which she does not have desaturation and she has been seen by pulmonary as unimproved. I am concerned she has heart failure we will check a proBNP level recheck renal function potassium keep her on her loop diuretic and get an echocardiogram as quickly as possible She also has had some chest discomfort placed about self back on oral nitrate and overall this is unrevealing may benefit from repeat coronary arteriography with the concern of early graft occlusion however her EKG in my office today shows sinus rhythm no pattern of infarction she does have left atrial abnormality and nonspecific T waves Past Medical History:  Diagnosis Date    Adrenal nodule (HCC)    Aortic heart murmur on examination    Chest pressure 11/29/2015   Dyspnea on exertion 11/29/2015   Essential hypertension 10/14/2015   Family history of coronary artery disease 11/29/2015   Fatigue 10/14/2015   Frequent UTI 10/14/2015   Generalized anxiety disorder    Insomnia, idiopathic    Left lower quadrant pain 10/14/2015   Mixed hyperlipidemia    Panlobular emphysema (HCC) 11/29/2015   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 USING LEFT INTERNAL MAMMARY ARTERY & RIGHT GREATER SAPHENOUS VEIN HARVESTED ENDOSCOPICALLY.;  Surgeon: Lovett Sox, MD;  Location: [facility] OR;  Service: Open Heart Surgery;  Laterality: N/A;   LEFT HEART CATH AND CORONARY ANGIOGRAPHY N/A 02/08/2022   Procedure: LEFT HEART CATH AND CORONARY ANGIOGRAPHY;  Surgeon: Orbie Pyo, MD;  Location: [facility] INVASIVE CV LAB;  Service:  Cardiovascular;  Laterality: N/A;   TEE WITHOUT CARDIOVERSION N/A 02/14/2022   Procedure: TRANSESOPHAGEAL ECHOCARDIOGRAM (TEE);  Surgeon: Lovett Sox, MD;  Location: [facility] OR;  Service: Open Heart Surgery;  Laterality: N/A;    Current Medications: Current Meds  Medication Sig   aspirin EC 325 MG EC tablet Take 1 tablet (325 mg total) by mouth daily.   atorvastatin (LIPITOR) 80 MG tablet Take 1 tablet (80 mg total) by mouth daily.   furosemide (LASIX) 40 MG tablet Take 1 tablet (40 mg total) by mouth daily.   gabapentin (NEURONTIN) 100 MG capsule Take 2 capsules (200 mg total) by mouth 3 (three) times daily.   isosorbide mononitrate (IMDUR) 30 MG 24 hr tablet Take 30 mg by mouth daily.   metoprolol tartrate (LOPRESSOR) 25 MG tablet Take 0.5 tablets (12.5 mg total) by mouth 2 (two) times  daily.   Multiple Vitamin (MULTIVITAMIN WITH MINERALS) TABS tablet Take 1 tablet by mouth daily.   olmesartan-hydrochlorothiazide (BENICAR HCT) 20-12.5 MG tablet Take 1 tablet by mouth daily.   Omega-3 Fatty Acids (FISH OIL) 1000 MG CAPS Take 1 capsule by mouth daily.   potassium chloride (KLOR-CON) 10 MEQ tablet Take 10 mEq by mouth daily.   traZODone (DESYREL) 100 MG tablet Take 100 mg by mouth at bedtime.     Allergies:   Codeine and Bismuth subsalicylate   Social History   Socioeconomic History   Marital status: Married    Spouse name: Peyton Najjar   Number of children: 2   Years of education: Not on file   Highest education level: Not on file  Occupational History   Not on file  Tobacco Use   Smoking status: Former    Packs/day: 2.00    Types: Cigarettes    Start date: 61    Quit date: 2011    Years since quitting: 12.5    Passive exposure: Past   Smokeless tobacco: Never  Vaping Use   Vaping Use: Never used  Substance and Sexual Activity   Alcohol use: Yes    Comment: occasional   Drug use: Never   Sexual activity: Yes  Other Topics Concern   Not on file  Social History Narrative   Not on file   Social Determinants of Health   Financial Resource Strain: Not on file  Food Insecurity: Not on file  Transportation Needs: Not on file  Physical Activity: Not on file  Stress: Not on file  Social Connections: Not on file     Family History: The patient's family history includes Diabetes in her brother and brother; Diabetes type II in her mother; Heart attack in her father and mother; Hyperlipidemia in her mother; Hypertension in her brother, father, and mother; Liver cancer in her father; Stroke in her brother. ROS:   Please see the history of present illness.    All other systems reviewed and are negative.  EKGs/Labs/Other Studies Reviewed:    The following studies were reviewed today:  EKG:  EKG ordered today and personally reviewed.  The ekg ordered today  demonstrates sinus rhythm left atrial abnormality nonspecific ST segment  Recent Labs: 02/09/2022: TSH 2.619 02/15/2022: Magnesium 2.4 02/17/2022: Hemoglobin 9.6; Platelets 154 03/17/2022: ALT 20; BUN 15; Creatinine, Ser 0.53; Potassium 4.4; Sodium 146  Recent Lipid Panel    Component Value Date/Time   CHOL 118 03/17/2022 0830   TRIG 120 03/17/2022 0830   HDL 32 (L) 03/17/2022 0830   CHOLHDL 3.7 03/17/2022 0830   LDLCALC 64 03/17/2022 0830  Physical Exam:    VS:  BP 136/80 (BP Location: Right Arm, Patient Position: Sitting, Cuff Size: Normal)   Pulse 83   Ht 5\' 4"  (1.626 m)   Wt 194 lb (88 kg)   SpO2 93%   BMI 33.30 kg/m     Wt Readings from Last 3 Encounters:  05/24/22 194 lb (88 kg)  05/15/22 196 lb 6.4 oz (89.1 kg)  04/18/22 189 lb (85.7 kg)     GEN: She appears frustrated well nourished, well developed in no acute distress HEENT: Normal NECK: No JVD; No carotid bruits LYMPHATICS: No lymphadenopathy CARDIAC: RRR, no murmurs, rubs, gallops RESPIRATORY:  Clear to auscultation without rales, wheezing or rhonchi quite diminished breath sounds with prolonged expiration ABDOMEN: Soft, non-tender, non-distended MUSCULOSKELETAL: 1+ bilateral lower extremity pitting edema; No deformity  SKIN: Warm and dry NEUROLOGIC:  Alert and oriented x 3 PSYCHIATRIC:  Normal affect    Signed, 04/20/22, MD  05/24/2022 4:29 PM    [facility]

## 2022-05-24 ENCOUNTER — Encounter: Payer: Self-pay | Admitting: Cardiology

## 2022-05-24 ENCOUNTER — Ambulatory Visit (INDEPENDENT_AMBULATORY_CARE_PROVIDER_SITE_OTHER): Payer: BC Managed Care – PPO | Admitting: Cardiology

## 2022-05-24 ENCOUNTER — Telehealth: Payer: Self-pay | Admitting: Pulmonary Disease

## 2022-05-24 VITALS — BP 136/80 | HR 83 | Ht 64.0 in | Wt 194.0 lb

## 2022-05-24 DIAGNOSIS — I10 Essential (primary) hypertension: Secondary | ICD-10-CM | POA: Diagnosis not present

## 2022-05-24 DIAGNOSIS — J431 Panlobular emphysema: Secondary | ICD-10-CM

## 2022-05-24 DIAGNOSIS — I2510 Atherosclerotic heart disease of native coronary artery without angina pectoris: Secondary | ICD-10-CM

## 2022-05-24 DIAGNOSIS — Z0289 Encounter for other administrative examinations: Secondary | ICD-10-CM

## 2022-05-24 DIAGNOSIS — R0602 Shortness of breath: Secondary | ICD-10-CM | POA: Diagnosis not present

## 2022-05-24 DIAGNOSIS — E782 Mixed hyperlipidemia: Secondary | ICD-10-CM

## 2022-05-24 MED ORDER — GABAPENTIN 100 MG PO CAPS: 100.0000 mg | ORAL_CAPSULE | Freq: Two times a day (BID) | ORAL | 3 refills | Status: DC

## 2022-05-24 MED ORDER — FUROSEMIDE 40 MG PO TABS: 40.0000 mg | ORAL_TABLET | Freq: Every day | ORAL | 3 refills | Status: DC

## 2022-05-24 MED ORDER — POTASSIUM CHLORIDE ER 10 MEQ PO TBCR: 10.0000 meq | EXTENDED_RELEASE_TABLET | Freq: Every day | ORAL | 3 refills | Status: DC

## 2022-05-24 NOTE — Telephone Encounter (Signed)
Received a faxed request from [employer] for FMLA - to document patient's work restrictions.  Complete as much of the form as I could and sent to Dr. Isaiah Serge to complete when he returns to the office on 7/28.

## 2022-05-24 NOTE — Patient Instructions (Signed)
Medication Instructions:  Your physician has recommended you make the following change in your medication:   START: Gabapentin 100 mg twice daily  *If you need a refill on your cardiac medications before your next appointment, please call your pharmacy*   Lab Work: Your physician recommends that you return for lab work in:   Labs today: BMP, Pro BNP  If you have labs (blood work) drawn today and your tests are completely normal, you will receive your results only by: MyChart Message (if you have MyChart) OR A paper copy in the mail If you have any lab test that is abnormal or we need to change your treatment, we will call you to review the results.   Testing/Procedures: Your physician has requested that you have an echocardiogram. Echocardiography is a painless test that uses sound waves to create images of your heart. It provides your doctor with information about the size and shape of your heart and how well your heart's chambers and valves are working. This procedure takes approximately one hour. There are no restrictions for this procedure.    Follow-Up: At [facility], you and your health needs are our priority.  As part of our continuing mission to provide you with exceptional heart care, we have created designated Provider Care Teams.  These Care Teams include your primary Cardiologist (physician) and Advanced Practice Providers (APPs -  Physician Assistants and Nurse Practitioners) who all work together to provide you with the care you need, when you need it.  We recommend signing up for the patient portal called "MyChart".  Sign up information is provided on this After Visit Summary.  MyChart is used to connect with patients for Virtual Visits (Telemedicine).  Patients are able to view lab/test results, encounter notes, upcoming appointments, etc.  Non-urgent messages can be sent to your provider as well.   To learn more about what you can do with MyChart, go to  [email or web address].    Your next appointment:   1 month(s)  The format for your next appointment:   In Person  Provider:   Norman Herrlich, MD    Other Instructions None  Important Information About Sugar

## 2022-05-25 LAB — BASIC METABOLIC PANEL
BUN/Creatinine Ratio: 25 (ref 12–28)
BUN: 16 mg/dL (ref 8–27)
CO2: 22 mmol/L (ref 20–29)
Calcium: 9.5 mg/dL (ref 8.7–10.3)
Chloride: 98 mmol/L (ref 96–106)
Creatinine, Ser: 0.65 mg/dL (ref 0.57–1.00)
Glucose: 104 mg/dL — ABNORMAL HIGH (ref 70–99)
Potassium: 4.2 mmol/L (ref 3.5–5.2)
Sodium: 137 mmol/L (ref 134–144)
eGFR: 101 mL/min/{1.73_m2} (ref >59)

## 2022-05-25 LAB — PRO B NATRIURETIC PEPTIDE: NT-Pro BNP: 63 pg/mL (ref 0–287)

## 2022-05-26 ENCOUNTER — Ambulatory Visit (INDEPENDENT_AMBULATORY_CARE_PROVIDER_SITE_OTHER): Payer: BC Managed Care – PPO

## 2022-05-26 DIAGNOSIS — R0602 Shortness of breath: Secondary | ICD-10-CM | POA: Diagnosis not present

## 2022-05-26 DIAGNOSIS — J431 Panlobular emphysema: Secondary | ICD-10-CM

## 2022-05-26 DIAGNOSIS — I2510 Atherosclerotic heart disease of native coronary artery without angina pectoris: Secondary | ICD-10-CM | POA: Diagnosis not present

## 2022-05-26 DIAGNOSIS — I10 Essential (primary) hypertension: Secondary | ICD-10-CM

## 2022-05-26 DIAGNOSIS — E782 Mixed hyperlipidemia: Secondary | ICD-10-CM

## 2022-05-26 LAB — ECHOCARDIOGRAM COMPLETE
Area-P 1/2: 4.31 cm2
S' Lateral: 2.8 cm

## 2022-06-04 ENCOUNTER — Other Ambulatory Visit: Payer: Self-pay | Admitting: Home Health

## 2022-06-05 DIAGNOSIS — Z0289 Encounter for other administrative examinations: Secondary | ICD-10-CM

## 2022-06-05 NOTE — Telephone Encounter (Signed)
Faxed signed disability form to [employer]  attn: Kizzie Fantasia   fax# [phone].  Included office notes from 04/18/22 and 05/18/22.  Mailed hard copy to patient.

## 2022-06-19 ENCOUNTER — Ambulatory Visit (INDEPENDENT_AMBULATORY_CARE_PROVIDER_SITE_OTHER): Payer: BC Managed Care – PPO | Admitting: Pulmonary Disease

## 2022-06-19 ENCOUNTER — Encounter: Payer: Self-pay | Admitting: Pulmonary Disease

## 2022-06-19 VITALS — BP 116/68 | HR 78 | Temp 98.2°F | Ht 64.0 in | Wt 200.4 lb

## 2022-06-19 DIAGNOSIS — Z720 Tobacco use: Secondary | ICD-10-CM

## 2022-06-19 DIAGNOSIS — J431 Panlobular emphysema: Secondary | ICD-10-CM | POA: Diagnosis not present

## 2022-06-19 DIAGNOSIS — R0609 Other forms of dyspnea: Secondary | ICD-10-CM | POA: Diagnosis not present

## 2022-06-19 MED ORDER — TRELEGY ELLIPTA 200-62.5-25 MCG/ACT IN AEPB: 1.0000 | INHALATION_SPRAY | Freq: Every day | RESPIRATORY_TRACT | 0 refills | Status: DC

## 2022-06-19 MED ORDER — TRELEGY ELLIPTA 200-62.5-25 MCG/ACT IN AEPB: 1.0000 | INHALATION_SPRAY | Freq: Every day | RESPIRATORY_TRACT | 2 refills | Status: DC

## 2022-06-19 NOTE — Patient Instructions (Signed)
I reviewed his CT scan which does not show significant interstitial lung disease which is good news We will refer you for low-dose screening CT of the chest to start in June 2024 Return to clinic in 1 year.

## 2022-06-19 NOTE — Progress Notes (Signed)
[address] Ashonti Bryant    [id number]    29-Dec-1960  Primary Care Physician:Ferrell, Buford Dresser, NP  Referring Physician: Tamsen Snider, NP [address] [place],  [place] [address]  Chief complaint: Follow-up for emphysema.  HPI: 61 y.o. with history of coronary artery disease, hypertension, emphysema Referred for evaluation of dyspnea, desats on exertion at rehab She recently had a heart catheterization with triple-vessel disease and underwent CABG in April 2023.  Post CABG she attended rehab and was noted to have desats which improved over time.  Not currently on any inhalers.  No cough, sputum production, dyspnea  Pets: Dog, cat Occupation: Armed forces technical officer at a [employer] Exposures: No mold, hot tub, Jacuzzi.  No feather pillows or comforters Smoking history: 40-pack-year smoker.  Quit in 2011 Travel history: No significant travel history Relevant family history: No family history of lung disease  Interim history: Continues to have dyspnea on exertion.  She has followed up with cardiology who noted that her symptoms are out of proportion to extent of coronary artery disease She is here for review of CT   Outpatient Encounter Medications as of 06/19/2022  Medication Sig   aspirin EC 325 MG EC tablet Take 1 tablet (325 mg total) by mouth daily.   atorvastatin (LIPITOR) 80 MG tablet Take 1 tablet by mouth once daily   furosemide (LASIX) 40 MG tablet Take 1 tablet (40 mg total) by mouth daily.   gabapentin (NEURONTIN) 100 MG capsule Take 1 capsule (100 mg total) by mouth 2 (two) times daily.   isosorbide mononitrate (IMDUR) 30 MG 24 hr tablet Take 1 tablet by mouth once daily   metoprolol tartrate (LOPRESSOR) 25 MG tablet Take 0.5 tablets (12.5 mg total) by mouth 2 (two) times daily.   Multiple Vitamin (MULTIVITAMIN WITH MINERALS) TABS tablet Take 1 tablet by mouth daily.   olmesartan-hydrochlorothiazide (BENICAR HCT) 20-12.5 MG tablet Take 1  tablet by mouth daily.   Omega-3 Fatty Acids (FISH OIL) 1000 MG CAPS Take 1 capsule by mouth daily.   potassium chloride (KLOR-CON) 10 MEQ tablet Take 1 tablet (10 mEq total) by mouth daily.   traZODone (DESYREL) 100 MG tablet Take 100 mg by mouth at bedtime.   No facility-administered encounter medications on file as of 06/19/2022.    Physical Exam: Blood pressure 118/60, pulse 77, temperature 98.1 F (36.7 C), temperature source Oral, height 5\' 4"  (1.626 m), weight 189 lb (85.7 kg), SpO2 97 %. Gen:      No acute distress HEENT:  EOMI, sclera anicteric Neck:     No masses; no thyromegaly Lungs:    Clear to auscultation bilaterally; normal respiratory effort CV:         Regular rate and rhythm; no murmurs Abd:      + bowel sounds; soft, non-tender; no palpable masses, no distension Ext:    No edema; adequate peripheral perfusion Skin:      Warm and dry; no rash Neuro: alert and oriented x 3 Psych: normal mood and affect  Data Reviewed: Imaging: CTA 12/01/2021 Emphysematous changes, no PE, bibasal atelectasis  CT abdomen 01/11/2022 Mild dependent subsegmental atelectasis.  [facility] at [facility] 04/25/22 reviewed No acute airspace disease or interstitial lung disease, postsurgical changes of CABG, aortic atherosclerosis and emphysema.  I reviewed the images personally.  PFTs: 02/13/2022 FVC 3.98 [120%], FEV1 3.11 [121%], F/F 78, TLC 6.84 [135%], DLCO 20.62 [101%] Hyperinflation and air trapping  Labs:  Assessment:  Emphysema No significant obstruction noted on PFTs.  Which she continues to be symptomatic with significant dyspnea on exertion.  We will give her a sample of Trelegy 200 and send in a prescription to see if it helps  High-resolution CT reviewed with no significant interstitial lung disease.  Refer for low-dose screening CT to start in 2024  Suspected OSA She had a sleep study around January 2023 from a primary care which was nondiagnostic.  She may need to in lab  study and her primary care is working on this.   Plan/Recommendations: Follow-up trelegy inhaler Refer for low-dose screening CT.  Chilton Greathouse MD [facility] 06/19/2022, 11:16 AM  CC: Tamsen Snider, NP

## 2022-06-19 NOTE — Addendum Note (Signed)
Addended by: Jacquiline Doe on: 06/19/2022 11:32 AM   Modules accepted: Orders

## 2022-06-26 ENCOUNTER — Ambulatory Visit: Payer: BC Managed Care – PPO | Admitting: Cardiothoracic Surgery

## 2022-06-27 ENCOUNTER — Ambulatory Visit (INDEPENDENT_AMBULATORY_CARE_PROVIDER_SITE_OTHER): Payer: BC Managed Care – PPO | Admitting: Cardiothoracic Surgery

## 2022-06-27 ENCOUNTER — Encounter: Payer: Self-pay | Admitting: Cardiothoracic Surgery

## 2022-06-27 VITALS — BP 127/83 | HR 70 | Resp 20 | Ht 64.0 in | Wt 201.0 lb

## 2022-06-27 DIAGNOSIS — Z5189 Encounter for other specified aftercare: Secondary | ICD-10-CM

## 2022-06-27 DIAGNOSIS — I25119 Atherosclerotic heart disease of native coronary artery with unspecified angina pectoris: Secondary | ICD-10-CM | POA: Diagnosis not present

## 2022-06-27 DIAGNOSIS — Z951 Presence of aortocoronary bypass graft: Secondary | ICD-10-CM

## 2022-06-27 HISTORY — DX: Encounter for other specified aftercare: Z51.89

## 2022-06-27 NOTE — Progress Notes (Signed)
HPI Patient presents for final postop visit 4 months after multivessel CABG.  Patient had moderate coronary disease but significant symptoms, somewhat proportion to the coronary arteriogram.  She underwent uneventful three-vessel CABG.  She is now over 3 months postop but still has exertional shortness of breath.  She was seen by pulmonary who reviewed her CT scan.  Her preoperative PFTs are consistent with mild to moderate COPD.  She does not complain of chest pain but of fatigue decreased exercise tolerance and swelling.  She is maintaining sinus rhythm.  Sternal incision is well-healed.  I do not see a hemoglobin value since her perioperative period which was hematocrit of 28..  Current Outpatient Medications  Medication Sig Dispense Refill   aspirin EC 325 MG EC tablet Take 1 tablet (325 mg total) by mouth daily.     atorvastatin (LIPITOR) 80 MG tablet Take 1 tablet by mouth once daily 90 tablet 3   Fluticasone-Umeclidin-Vilant (TRELEGY ELLIPTA) 200-62.5-25 MCG/ACT AEPB Inhale 1 puff into the lungs daily. 14 each 0   furosemide (LASIX) 40 MG tablet Take 1 tablet (40 mg total) by mouth daily. 90 tablet 3   gabapentin (NEURONTIN) 100 MG capsule Take 1 capsule (100 mg total) by mouth 2 (two) times daily. 180 capsule 3   isosorbide mononitrate (IMDUR) 30 MG 24 hr tablet Take 1 tablet by mouth once daily 90 tablet 3   metoprolol tartrate (LOPRESSOR) 25 MG tablet Take 0.5 tablets (12.5 mg total) by mouth 2 (two) times daily. 90 tablet 3   Multiple Vitamin (MULTIVITAMIN WITH MINERALS) TABS tablet Take 1 tablet by mouth daily.     olmesartan-hydrochlorothiazide (BENICAR HCT) 20-12.5 MG tablet Take 1 tablet by mouth daily. 90 tablet 3   Omega-3 Fatty Acids (FISH OIL) 1000 MG CAPS Take 1 capsule by mouth daily.     potassium chloride (KLOR-CON) 10 MEQ tablet Take 1 tablet (10 mEq total) by mouth daily. 90 tablet 3   traZODone (DESYREL) 100 MG tablet Take 100 mg by mouth at bedtime.      Fluticasone-Umeclidin-Vilant (TRELEGY ELLIPTA) 200-62.5-25 MCG/ACT AEPB Inhale 1 puff into the lungs daily. (Patient not taking: Reported on 06/27/2022) 60 each 2   No current facility-administered medications for this visit.     Review of Systems: Fatigue with normal activity but no significant chest pain Neuropathic pain of her chest and leg have improved with time. Physical Exam      Exam    General- alert and comfortable    Neck- no JVD, no cervical adenopathy palpable, no carotid bruit   Lungs- clear without rales, wheezes   Cor- regular rate and rhythm, no murmur , gallop   Abdomen- soft, non-tender   Extremities - warm, non-tender, minimal edema   Neuro- oriented, appropriate, no focal weakness    Diagnostic Tests: None  Impression: Slow course following multivessel CABG. Appears to be well-healed from surgery however. Plan:  Repeat CBC. Follow-up with cardiology.  Repeat cath or cardiac CT with FFR is being considered.   Lovett Sox, MD [facility] [phone]

## 2022-06-29 NOTE — H&P (View-Only) (Signed)
Cardiology Office Note:    Date:  06/30/2022   ID:  [name], DOB 07-31-1961, MRN [id number]  PCP:  Kendra Snider, NP  Cardiologist:  Kendra Herrlich, MD    Referring MD: Kendra Snider, NP    ASSESSMENT:    1. SOB (shortness of breath)   2. Panlobular emphysema (HCC)   3. Coronary artery disease involving native heart without angina pectoris, unspecified vessel or lesion type   4. Essential hypertension   5. Mixed hyperlipidemia    PLAN:    In order of problems listed above:  This is a difficult case prior to bypass surgery she had anginal equivalent shortness of breath and now her clinical problem is severe shortness of breath disproportionate to CT scan and PFTs although she shows me a Fitbit watch with desaturation at nighttime.  I think she needs to undergo coronary angiography to resolve any issues of early graft occlusion she has no findings of heart failure and of her coronary angiography is reassuring I think her back to pulmonary etiologies.  Options benefits and risks detailed and she agrees to remain out of work pending results Stable blood pressure is at target continue ARB beta-blocker diuretic Continue current lipid-lowering therapy high intensity statin CBC including precath orders   Next appointment: 4 weeks   Medication Adjustments/Labs and Tests Ordered: Current medicines are reviewed at length with the patient today.  Concerns regarding medicines are outlined above.  No orders of the defined types were placed in this encounter.  No orders of the defined types were placed in this encounter.   Chief Complaint  Patient presents with   Follow-up   Shortness of Breath    History of Present Illness:    [name] is a 61 y.o. female with a hx of COPD/panlobular emphysema CAD hypertension and hyper lipidemia last seen 05/24/2022 with ongoing severe shortness of breath following CABG 02/14/2022.    Compliance with  diet, lifestyle and medications: Yes  She had an EKG performed 05/26/2022 left ventricle normal in size wall thickness and systolic function and diastolic function.  Right ventricle normal size and function without evidence of pulmonary hypertension and no valvular abnormality noted.  Repeat proBNP level remains low at 63.  She was seen by pulmonary consultant 06/19/2022.  She had PFTs done which showed no significant obstruction and her high-resolution CT scan showed no interstitial lung disease.  She was placed on a bronchodilator.  Unfortunately she is not doing well she is extremely limited in her activity short of breath even with ADLs and struggles to bathe. She is not having angina in retrospect she was short of breath prior to CABG and is felt to be anginal equivalent Is a chronic cough in the morning a little bit of sputum but no hemoptysis and does not wheeze She has no orthopnea palpitation or syncope Follow-up echocardiogram 05/26/2022 showed normal left ventricular systolic diastolic function and no finding of pulmonary artery hypertension or right ventricular dysfunction She did not have lab work performed at [facility] follow-up they noted that she had perioperative anemia We will have her scheduled to undergo diagnostic left heart catheterization as she may be having  ischemia with anginal equivalent shortness of breath I think it will answer the clinical question I am suspicious that this is all pulmonary in etiology She is a Fitbit watch that shows oxygen is low at nighttime.  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 USING LEFT INTERNAL MAMMARY ARTERY & RIGHT GREATER SAPHENOUS VEIN HARVESTED ENDOSCOPICALLY.;  Surgeon: Vantrigt, Peter, MD;  Location: [facility] OR;  Service: Open Heart Surgery;  Laterality: N/A;   LEFT HEART CATH AND CORONARY ANGIOGRAPHY N/A 02/08/2022   Procedure: LEFT HEART CATH AND CORONARY ANGIOGRAPHY;  Surgeon: Thukkani, Arun K, MD;  Location: [facility] INVASIVE [facility];  Service: Cardiovascular;  Laterality: N/A;   TEE WITHOUT CARDIOVERSION N/A 02/14/2022   Procedure: TRANSESOPHAGEAL ECHOCARDIOGRAM (TEE);  Surgeon: Vantrigt, Peter, MD;  Location: [facility] OR;  Service: Open Heart Surgery;  Laterality: N/A;    Current Medications: Current Meds  Medication Sig   aspirin EC 325 MG EC tablet Take 1 tablet (325 mg total) by mouth daily.   atorvastatin (LIPITOR) 80 MG tablet Take 1 tablet by mouth once daily   Fluticasone-Umeclidin-Vilant (TRELEGY ELLIPTA) 200-62.5-25 MCG/ACT AEPB Inhale 1 puff into the lungs daily.   furosemide (LASIX) 40 MG tablet Take 1 tablet (40 mg total) by mouth daily.   gabapentin (NEURONTIN) 100 MG capsule Take 1 capsule (100 mg total) by mouth 2 (two) times daily.   isosorbide mononitrate (IMDUR) 30 MG 24 hr tablet Take 1 tablet by mouth once daily   metoprolol tartrate (LOPRESSOR) 25 MG tablet Take 0.5 tablets (12.5 mg total) by mouth 2 (two) times daily.   Multiple Vitamin (MULTIVITAMIN WITH MINERALS) TABS tablet Take 1 tablet by mouth daily.   olmesartan-hydrochlorothiazide (BENICAR HCT) 20-12.5 MG tablet Take 1 tablet by mouth daily.   Omega-3 Fatty Acids (FISH OIL) 1000 MG CAPS Take 1 capsule by mouth daily.    potassium chloride (KLOR-CON) 10 MEQ tablet Take 1 tablet (10 mEq total) by mouth daily.   traZODone (DESYREL) 100 MG tablet Take 100 mg by mouth at bedtime.     Allergies:   Codeine and Bismuth subsalicylate   Social History   Socioeconomic History   Marital status: Married    Spouse name: Larry   Number of children: 2   Years of education: Not on file   Highest education level: Not on file  Occupational History   Not on file  Tobacco Use   Smoking status: Former    Packs/day: 2.00    Types: Cigarettes    Start date: 1977    Quit date: 2011    Years since quitting: 12.6    Passive exposure: Past   Smokeless tobacco: Never  Vaping Use   Vaping Use: Never used  Substance and Sexual Activity   Alcohol use: Yes    Comment: occasional   Drug use: Never   Sexual activity: Yes  Other Topics Concern   Not on file  Social History Narrative   Not on file   Social Determinants of Health     Financial Resource Strain: Not on file  Food Insecurity: Not on file  Transportation Needs: Not on file  Physical Activity: Not on file  Stress: Not on file  Social Connections: Not on file     Family History: The patient's family history includes Diabetes in her brother and brother; Diabetes type II in her mother; Heart attack in her father and mother; Hyperlipidemia in her mother; Hypertension in her brother, father, and mother; Liver cancer in her father; Stroke in her brother. ROS:   Please see the history of present illness.    All other systems reviewed and are negative.  EKGs/Labs/Other Studies Reviewed:    The following studies were reviewed today:  EKG:  EKG ordered today and personally reviewed.  The ekg ordered today demonstrates sinus rhythm pattern of anteroseptal infarction left atrial enlargement  Recent Labs: 02/09/2022: TSH 2.619 02/15/2022: Magnesium 2.4 02/17/2022: Hemoglobin 9.6; Platelets 154 03/17/2022: ALT 20 05/24/2022: BUN 16; Creatinine, Ser 0.65; NT-Pro BNP  63; Potassium 4.2; Sodium 137  Recent Lipid Panel    Component Value Date/Time   CHOL 118 03/17/2022 0830   TRIG 120 03/17/2022 0830   HDL 32 (L) 03/17/2022 0830   CHOLHDL 3.7 03/17/2022 0830   LDLCALC 64 03/17/2022 0830    Physical Exam:    VS:  BP 120/78 (BP Location: Left Arm, Patient Position: Sitting, Cuff Size: Normal)   Pulse 74   Ht 5\' 4"  (1.626 m)   Wt 200 lb 3.2 oz (90.8 kg)   SpO2 99%   BMI 34.36 kg/m     Wt Readings from Last 3 Encounters:  06/30/22 200 lb 3.2 oz (90.8 kg)  06/27/22 201 lb (91.2 kg)  06/19/22 200 lb 6.4 oz (90.9 kg)     GEN: She looks very fatigued breathless in the room although her oxygen saturation is normal well nourished, well developed in no acute distress HEENT: Normal NECK: No JVD; No carotid bruits LYMPHATICS: No lymphadenopathy CARDIAC: RRR, no murmurs, rubs, gallops RESPIRATORY: Diminished breath sounds no rales or wheezing ABDOMEN: Soft, non-tender, non-distended MUSCULOSKELETAL:  No edema; No deformity  SKIN: Warm and dry NEUROLOGIC:  Alert and oriented x 3 PSYCHIATRIC:  Normal affect    Signed, 06/21/22, MD  06/30/2022 9:51 AM    [facility]

## 2022-06-29 NOTE — Progress Notes (Signed)
Cardiology Office Note:    Date:  06/30/2022   ID:  [name], DOB 07-31-1961, MRN [id number]  PCP:  Tamsen Snider, NP  Cardiologist:  Norman Herrlich, MD    Referring MD: Tamsen Snider, NP    ASSESSMENT:    1. SOB (shortness of breath)   2. Panlobular emphysema (HCC)   3. Coronary artery disease involving native heart without angina pectoris, unspecified vessel or lesion type   4. Essential hypertension   5. Mixed hyperlipidemia    PLAN:    In order of problems listed above:  This is a difficult case prior to bypass surgery she had anginal equivalent shortness of breath and now her clinical problem is severe shortness of breath disproportionate to CT scan and PFTs although she shows me a Fitbit watch with desaturation at nighttime.  I think she needs to undergo coronary angiography to resolve any issues of early graft occlusion she has no findings of heart failure and of her coronary angiography is reassuring I think her back to pulmonary etiologies.  Options benefits and risks detailed and she agrees to remain out of work pending results Stable blood pressure is at target continue ARB beta-blocker diuretic Continue current lipid-lowering therapy high intensity statin CBC including precath orders   Next appointment: 4 weeks   Medication Adjustments/Labs and Tests Ordered: Current medicines are reviewed at length with the patient today.  Concerns regarding medicines are outlined above.  No orders of the defined types were placed in this encounter.  No orders of the defined types were placed in this encounter.   Chief Complaint  Patient presents with   Follow-up   Shortness of Breath    History of Present Illness:    [name] is a 61 y.o. female with a hx of COPD/panlobular emphysema CAD hypertension and hyper lipidemia last seen 05/24/2022 with ongoing severe shortness of breath following CABG 02/14/2022.    Compliance with  diet, lifestyle and medications: Yes  She had an EKG performed 05/26/2022 left ventricle normal in size wall thickness and systolic function and diastolic function.  Right ventricle normal size and function without evidence of pulmonary hypertension and no valvular abnormality noted.  Repeat proBNP level remains low at 63.  She was seen by pulmonary consultant 06/19/2022.  She had PFTs done which showed no significant obstruction and her high-resolution CT scan showed no interstitial lung disease.  She was placed on a bronchodilator.  Unfortunately she is not doing well she is extremely limited in her activity short of breath even with ADLs and struggles to bathe. She is not having angina in retrospect she was short of breath prior to CABG and is felt to be anginal equivalent Is a chronic cough in the morning a little bit of sputum but no hemoptysis and does not wheeze She has no orthopnea palpitation or syncope Follow-up echocardiogram 05/26/2022 showed normal left ventricular systolic diastolic function and no finding of pulmonary artery hypertension or right ventricular dysfunction She did not have lab work performed at [facility] follow-up they noted that she had perioperative anemia We will have her scheduled to undergo diagnostic left heart catheterization as she may be having  ischemia with anginal equivalent shortness of breath I think it will answer the clinical question I am suspicious that this is all pulmonary in etiology She is a Fitbit watch that shows oxygen is low at nighttime.  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 USING LEFT INTERNAL MAMMARY ARTERY & RIGHT GREATER SAPHENOUS VEIN HARVESTED ENDOSCOPICALLY.;  Surgeon: Lovett Sox, MD;  Location: [facility] OR;  Service: Open Heart Surgery;  Laterality: N/A;   LEFT HEART CATH AND CORONARY ANGIOGRAPHY N/A 02/08/2022   Procedure: LEFT HEART CATH AND CORONARY ANGIOGRAPHY;  Surgeon: Orbie Pyo, MD;  Location: [facility];  Service: Cardiovascular;  Laterality: N/A;   TEE WITHOUT CARDIOVERSION N/A 02/14/2022   Procedure: TRANSESOPHAGEAL ECHOCARDIOGRAM (TEE);  Surgeon: Lovett Sox, MD;  Location: [facility] OR;  Service: Open Heart Surgery;  Laterality: N/A;    Current Medications: Current Meds  Medication Sig   aspirin EC 325 MG EC tablet Take 1 tablet (325 mg total) by mouth daily.   atorvastatin (LIPITOR) 80 MG tablet Take 1 tablet by mouth once daily   Fluticasone-Umeclidin-Vilant (TRELEGY ELLIPTA) 200-62.5-25 MCG/ACT AEPB Inhale 1 puff into the lungs daily.   furosemide (LASIX) 40 MG tablet Take 1 tablet (40 mg total) by mouth daily.   gabapentin (NEURONTIN) 100 MG capsule Take 1 capsule (100 mg total) by mouth 2 (two) times daily.   isosorbide mononitrate (IMDUR) 30 MG 24 hr tablet Take 1 tablet by mouth once daily   metoprolol tartrate (LOPRESSOR) 25 MG tablet Take 0.5 tablets (12.5 mg total) by mouth 2 (two) times daily.   Multiple Vitamin (MULTIVITAMIN WITH MINERALS) TABS tablet Take 1 tablet by mouth daily.   olmesartan-hydrochlorothiazide (BENICAR HCT) 20-12.5 MG tablet Take 1 tablet by mouth daily.   Omega-3 Fatty Acids (FISH OIL) 1000 MG CAPS Take 1 capsule by mouth daily.    potassium chloride (KLOR-CON) 10 MEQ tablet Take 1 tablet (10 mEq total) by mouth daily.   traZODone (DESYREL) 100 MG tablet Take 100 mg by mouth at bedtime.     Allergies:   Codeine and Bismuth subsalicylate   Social History   Socioeconomic History   Marital status: Married    Spouse name: [name]   Number of children: 2   Years of education: Not on file   Highest education level: Not on file  Occupational History   Not on file  Tobacco Use   Smoking status: Former    Packs/day: 2.00    Types: Cigarettes    Start date: 10    Quit date: 2011    Years since quitting: 12.6    Passive exposure: Past   Smokeless tobacco: Never  Vaping Use   Vaping Use: Never used  Substance and Sexual Activity   Alcohol use: Yes    Comment: occasional   Drug use: Never   Sexual activity: Yes  Other Topics Concern   Not on file  Social History Narrative   Not on file   Social Determinants of Health  Financial Resource Strain: Not on file  Food Insecurity: Not on file  Transportation Needs: Not on file  Physical Activity: Not on file  Stress: Not on file  Social Connections: Not on file     Family History: The patient's family history includes Diabetes in her brother and brother; Diabetes type II in her mother; Heart attack in her father and mother; Hyperlipidemia in her mother; Hypertension in her brother, father, and mother; Liver cancer in her father; Stroke in her brother. ROS:   Please see the history of present illness.    All other systems reviewed and are negative.  EKGs/Labs/Other Studies Reviewed:    The following studies were reviewed today:  EKG:  EKG ordered today and personally reviewed.  The ekg ordered today demonstrates sinus rhythm pattern of anteroseptal infarction left atrial enlargement  Recent Labs: 02/09/2022: TSH 2.619 02/15/2022: Magnesium 2.4 02/17/2022: Hemoglobin 9.6; Platelets 154 03/17/2022: ALT 20 05/24/2022: BUN 16; Creatinine, Ser 0.65; NT-Pro BNP  63; Potassium 4.2; Sodium 137  Recent Lipid Panel    Component Value Date/Time   CHOL 118 03/17/2022 0830   TRIG 120 03/17/2022 0830   HDL 32 (L) 03/17/2022 0830   CHOLHDL 3.7 03/17/2022 0830   LDLCALC 64 03/17/2022 0830    Physical Exam:    VS:  BP 120/78 (BP Location: Left Arm, Patient Position: Sitting, Cuff Size: Normal)   Pulse 74   Ht 5\' 4"  (1.626 m)   Wt 200 lb 3.2 oz (90.8 kg)   SpO2 99%   BMI 34.36 kg/m     Wt Readings from Last 3 Encounters:  06/30/22 200 lb 3.2 oz (90.8 kg)  06/27/22 201 lb (91.2 kg)  06/19/22 200 lb 6.4 oz (90.9 kg)     GEN: She looks very fatigued breathless in the room although her oxygen saturation is normal well nourished, well developed in no acute distress HEENT: Normal NECK: No JVD; No carotid bruits LYMPHATICS: No lymphadenopathy CARDIAC: RRR, no murmurs, rubs, gallops RESPIRATORY: Diminished breath sounds no rales or wheezing ABDOMEN: Soft, non-tender, non-distended MUSCULOSKELETAL:  No edema; No deformity  SKIN: Warm and dry NEUROLOGIC:  Alert and oriented x 3 PSYCHIATRIC:  Normal affect    Signed, 06/21/22, MD  06/30/2022 9:51 AM    [facility]

## 2022-06-30 ENCOUNTER — Encounter: Payer: Self-pay | Admitting: Cardiology

## 2022-06-30 ENCOUNTER — Ambulatory Visit: Payer: BC Managed Care – PPO | Attending: Cardiology | Admitting: Cardiology

## 2022-06-30 VITALS — BP 120/78 | HR 74 | Ht 64.0 in | Wt 200.2 lb

## 2022-06-30 DIAGNOSIS — J431 Panlobular emphysema: Secondary | ICD-10-CM | POA: Diagnosis not present

## 2022-06-30 DIAGNOSIS — I10 Essential (primary) hypertension: Secondary | ICD-10-CM | POA: Diagnosis not present

## 2022-06-30 DIAGNOSIS — I2510 Atherosclerotic heart disease of native coronary artery without angina pectoris: Secondary | ICD-10-CM | POA: Diagnosis not present

## 2022-06-30 DIAGNOSIS — E782 Mixed hyperlipidemia: Secondary | ICD-10-CM

## 2022-06-30 DIAGNOSIS — R0602 Shortness of breath: Secondary | ICD-10-CM

## 2022-06-30 MED ORDER — ASPIRIN 81 MG PO TBEC: 81.0000 mg | DELAYED_RELEASE_TABLET | Freq: Every day | ORAL | 3 refills | Status: AC

## 2022-06-30 NOTE — Patient Instructions (Addendum)
Medication Instructions:  Your physician recommends that you continue on your current medications as directed. Please refer to the Current Medication list given to you today.  *If you need a refill on your cardiac medications before your next appointment, please call your pharmacy*   Lab Work: Your physician recommends that you return for lab work in:   Labs today: CBC, CMP, Protime  If you have labs (blood work) drawn today and your tests are completely normal, you will receive your results only by: MyChart Message (if you have MyChart) OR A paper copy in the mail If you have any lab test that is abnormal or we need to change your treatment, we will call you to review the results.   Testing/Procedures:  [facility] A DEPT OF [facility] [place] A DEPT OF [facility] [address] [place] [address] Dept: [phone] Loc: [phone]  [name] Trixy Loyola  06/30/2022  You are scheduled for a Cardiac Catheterization on Thursday, September 7 with Dr. Cristal [place].  1. Please arrive at the [facility] (Main Entrance A) at [facility]: [address] [place], [place] [address] at 10:00 AM (This time is two hours before your procedure to ensure your preparation). Free valet parking service is available.   Special note: Every effort is made to have your procedure done on time. Please understand that emergencies sometimes delay scheduled procedures.  2. Diet: Do not eat solid foods after midnight.  The patient may have clear liquids until 5am upon the day of the procedure.  3. Labs: You will need to have blood drawn on Friday, September 1 at [facility]: [address], [place], advertising . You do not need to be fasting.  4. Medication instructions in preparation for your procedure:   Contrast Allergy: No  HOLD: Lasix and Benicar the morning of the procedure  On the morning of your procedure, take  your Aspirin and any morning medicines NOT listed above.  You may use sips of water.  5. Plan for one night stay--bring personal belongings. 6. Bring a current list of your medications and current insurance cards. 7. You MUST have a responsible person to drive you home. 8. Someone MUST be with you the first 24 hours after you arrive home or your discharge will be delayed. 9. Please wear clothes that are easy to get on and off and wear slip-on shoes.  Thank you for allowing [place] to care for you!   -- [facility]    Follow-Up: At [facility], you and your health needs are our priority.  As part of our continuing mission to provide you with exceptional heart care, we have created designated Provider Care Teams.  These Care Teams include your primary Cardiologist (physician) and Advanced Practice Providers (APPs -  Physician Assistants and Nurse Practitioners) who all work together to provide you with the care you need, when you need it.  We recommend signing up for the patient portal called "MyChart".  Sign up information is provided on this After Visit Summary.  MyChart is used to connect with patients for Virtual Visits (Telemedicine).  Patients are able to view lab/test results, encounter notes, upcoming appointments, etc.  Non-urgent messages can be sent to your provider as well.   To learn more about what you can do with MyChart, go to [email or web address].    Your next appointment:   4 week(s)  The  format for your next appointment:   In Person  Provider:   Norman Herrlich, MD    Other Instructions None  Important Information About Sugar

## 2022-07-01 LAB — CBC
Hematocrit: 44.6 % (ref 34.0–46.6)
Hemoglobin: 15.8 g/dL (ref 11.1–15.9)
MCH: 31.5 pg (ref 26.6–33.0)
MCHC: 35.4 g/dL (ref 31.5–35.7)
MCV: 89 fL (ref 79–97)
Platelets: 302 10*3/uL (ref 150–450)
RBC: 5.01 x10E6/uL (ref 3.77–5.28)
RDW: 15.8 % — ABNORMAL HIGH (ref 11.7–15.4)
WBC: 8.8 10*3/uL (ref 3.4–10.8)

## 2022-07-01 LAB — COMPREHENSIVE METABOLIC PANEL
ALT: 28 IU/L (ref 0–32)
AST: 25 IU/L (ref 0–40)
Albumin/Globulin Ratio: 1.9 (ref 1.2–2.2)
Albumin: 4.9 g/dL (ref 3.8–4.9)
Alkaline Phosphatase: 80 IU/L (ref 44–121)
BUN/Creatinine Ratio: 23 (ref 12–28)
BUN: 16 mg/dL (ref 8–27)
Bilirubin Total: 0.3 mg/dL (ref 0.0–1.2)
CO2: 25 mmol/L (ref 20–29)
Calcium: 10 mg/dL (ref 8.7–10.3)
Chloride: 101 mmol/L (ref 96–106)
Creatinine, Ser: 0.69 mg/dL (ref 0.57–1.00)
Globulin, Total: 2.6 g/dL (ref 1.5–4.5)
Glucose: 106 mg/dL — ABNORMAL HIGH (ref 70–99)
Potassium: 5.1 mmol/L (ref 3.5–5.2)
Sodium: 140 mmol/L (ref 134–144)
Total Protein: 7.5 g/dL (ref 6.0–8.5)
eGFR: 99 mL/min/{1.73_m2} (ref >59)

## 2022-07-01 LAB — PROTIME-INR
INR: 0.9 (ref 0.9–1.2)
Prothrombin Time: 10.3 s (ref 9.1–12.0)

## 2022-07-05 ENCOUNTER — Telehealth: Payer: Self-pay | Admitting: *Deleted

## 2022-07-05 NOTE — Telephone Encounter (Signed)
Cardiac Catheterization scheduled at [facility] for Thursday July 06, 2022 12 Noon Arrival time and place: [facility] Main Entrance A at: 10 AM  Nothing to eat after midnight prior to procedure, clear liquids until 5 AM day of procedure.  Medication instructions: -Olmesartan-HCT/Lasix/KCl-AM of procedure  -Except hold medications usual morning medications can be taken with sips of water including aspirin 81 mg.  Confirmed patient has responsible adult to drive home post procedure and be with patient first 24 hours after arriving home.  Patient reports no new symptoms concerning for COVID-19 in the past 10 days.  Reviewed procedure instructions with patient.

## 2022-07-06 ENCOUNTER — Ambulatory Visit (HOSPITAL_COMMUNITY)
Admission: RE | Admit: 2022-07-06 | Discharge: 2022-07-06 | Disposition: A | Discharge status: Home / Self Care | Payer: BC Managed Care – PPO | Source: Ambulatory Visit | Attending: Internal Medicine | Admitting: Internal Medicine

## 2022-07-06 ENCOUNTER — Encounter (HOSPITAL_COMMUNITY)
Admission: RE | Disposition: A | Discharge status: Home / Self Care | Payer: Self-pay | Source: Ambulatory Visit | Attending: Internal Medicine

## 2022-07-06 ENCOUNTER — Other Ambulatory Visit: Payer: Self-pay

## 2022-07-06 DIAGNOSIS — J449 Chronic obstructive pulmonary disease, unspecified: Secondary | ICD-10-CM | POA: Diagnosis not present

## 2022-07-06 DIAGNOSIS — R0602 Shortness of breath: Secondary | ICD-10-CM | POA: Insufficient documentation

## 2022-07-06 DIAGNOSIS — I25110 Atherosclerotic heart disease of native coronary artery with unstable angina pectoris: Secondary | ICD-10-CM

## 2022-07-06 DIAGNOSIS — Z951 Presence of aortocoronary bypass graft: Secondary | ICD-10-CM | POA: Insufficient documentation

## 2022-07-06 DIAGNOSIS — I10 Essential (primary) hypertension: Secondary | ICD-10-CM | POA: Diagnosis not present

## 2022-07-06 DIAGNOSIS — E782 Mixed hyperlipidemia: Secondary | ICD-10-CM | POA: Insufficient documentation

## 2022-07-06 DIAGNOSIS — Z87891 Personal history of nicotine dependence: Secondary | ICD-10-CM | POA: Insufficient documentation

## 2022-07-06 DIAGNOSIS — I200 Unstable angina: Secondary | ICD-10-CM

## 2022-07-06 DIAGNOSIS — I2582 Chronic total occlusion of coronary artery: Secondary | ICD-10-CM | POA: Insufficient documentation

## 2022-07-06 DIAGNOSIS — I2510 Atherosclerotic heart disease of native coronary artery without angina pectoris: Secondary | ICD-10-CM

## 2022-07-06 HISTORY — PX: LEFT HEART CATH AND CORS/GRAFTS ANGIOGRAPHY: CATH118250

## 2022-07-06 SURGERY — LEFT HEART CATH AND CORS/GRAFTS ANGIOGRAPHY
Anesthesia: LOCAL

## 2022-07-06 MED ORDER — ASPIRIN 81 MG PO CHEW: 81.0000 mg | CHEWABLE_TABLET | ORAL | Status: DC

## 2022-07-06 MED ORDER — FENTANYL CITRATE (PF) 100 MCG/2ML IJ SOLN
INTRAMUSCULAR | Status: DC | PRN
  Administered 2022-07-06: 25 ug via INTRAVENOUS

## 2022-07-06 MED ORDER — IOHEXOL 350 MG/ML SOLN
INTRAVENOUS | Status: DC | PRN
  Administered 2022-07-06: 70 mL

## 2022-07-06 MED ORDER — HEPARIN SODIUM (PORCINE) 1000 UNIT/ML IJ SOLN
INTRAMUSCULAR | Status: DC | PRN
  Administered 2022-07-06: 4500 [IU] via INTRAVENOUS

## 2022-07-06 MED ORDER — MIDAZOLAM HCL 2 MG/2ML IJ SOLN
INTRAMUSCULAR | Status: AC
  Filled 2022-07-06: qty 2

## 2022-07-06 MED ORDER — VERAPAMIL HCL 2.5 MG/ML IV SOLN
INTRAVENOUS | Status: AC
  Filled 2022-07-06: qty 2

## 2022-07-06 MED ORDER — HEPARIN (PORCINE) IN NACL 1000-0.9 UT/500ML-% IV SOLN
INTRAVENOUS | Status: AC
  Filled 2022-07-06: qty 1000

## 2022-07-06 MED ORDER — HEPARIN (PORCINE) IN NACL 1000-0.9 UT/500ML-% IV SOLN
INTRAVENOUS | Status: DC | PRN
  Administered 2022-07-06 (×2): 500 mL

## 2022-07-06 MED ORDER — LIDOCAINE HCL (PF) 1 % IJ SOLN
INTRAMUSCULAR | Status: AC
  Filled 2022-07-06: qty 30

## 2022-07-06 MED ORDER — SODIUM CHLORIDE 0.9% FLUSH: 3.0000 mL | Freq: Two times a day (BID) | INTRAVENOUS | Status: DC

## 2022-07-06 MED ORDER — FENTANYL CITRATE (PF) 100 MCG/2ML IJ SOLN
INTRAMUSCULAR | Status: AC
  Filled 2022-07-06: qty 2

## 2022-07-06 MED ORDER — LIDOCAINE HCL (PF) 1 % IJ SOLN
INTRAMUSCULAR | Status: DC | PRN
  Administered 2022-07-06: 2 mL

## 2022-07-06 MED ORDER — VERAPAMIL HCL 2.5 MG/ML IV SOLN
INTRAVENOUS | Status: DC | PRN
  Administered 2022-07-06: 10 mL via INTRA_ARTERIAL

## 2022-07-06 MED ORDER — SODIUM CHLORIDE 0.9% FLUSH: 3.0000 mL | INTRAVENOUS | Status: DC | PRN

## 2022-07-06 MED ORDER — SODIUM CHLORIDE 0.9 % WEIGHT BASED INFUSION
3.0000 mL/kg/h | INTRAVENOUS | Status: AC
  Administered 2022-07-06: 3 mL/kg/h via INTRAVENOUS

## 2022-07-06 MED ORDER — SODIUM CHLORIDE 0.9 % IV SOLN: 250.0000 mL | INTRAVENOUS | Status: DC | PRN

## 2022-07-06 MED ORDER — MIDAZOLAM HCL 2 MG/2ML IJ SOLN
INTRAMUSCULAR | Status: DC | PRN
  Administered 2022-07-06: 1 mg via INTRAVENOUS

## 2022-07-06 MED ORDER — SODIUM CHLORIDE 0.9 % WEIGHT BASED INFUSION: 1.0000 mL/kg/h | INTRAVENOUS | Status: DC

## 2022-07-06 MED ORDER — HEPARIN SODIUM (PORCINE) 1000 UNIT/ML IJ SOLN
INTRAMUSCULAR | Status: AC
  Filled 2022-07-06: qty 10

## 2022-07-06 MED ORDER — RANOLAZINE ER 500 MG PO TB12: 500.0000 mg | ORAL_TABLET | Freq: Two times a day (BID) | ORAL | 5 refills | Status: DC

## 2022-07-06 SURGICAL SUPPLY — 13 items
BAND ZEPHYR COMPRESS 30 LONG (HEMOSTASIS) IMPLANT
CATH INFINITI 5 FR IM (CATHETERS) IMPLANT
CATH INFINITI 5 FR MPA2 (CATHETERS) IMPLANT
CATH INFINITI 6F ANG MULTIPACK (CATHETERS) IMPLANT
ELECT DEFIB PAD ADLT CADENCE (PAD) IMPLANT
GLIDESHEATH SLEND SS 6F .021 (SHEATH) IMPLANT
GUIDEWIRE INQWIRE 1.5J.035X260 (WIRE) IMPLANT
INQWIRE 1.5J .035X260CM (WIRE) ×1
KIT HEART LEFT (KITS) ×1 IMPLANT
PACK CARDIAC CATHETERIZATION (CUSTOM PROCEDURE TRAY) ×1 IMPLANT
SHIELD RADPAD SCOOP 12X17 (MISCELLANEOUS) IMPLANT
TRANSDUCER W/STOPCOCK (MISCELLANEOUS) ×1 IMPLANT
TUBING CIL FLEX 10 FLL-RA (TUBING) ×1 IMPLANT

## 2022-07-06 NOTE — Interval H&P Note (Signed)
History and Physical Interval Note:  07/06/2022 2:07 PM  [facility] Saphira Lahmann  has presented today for surgery, with the diagnosis of coronary artery disease with accelerating angina.  The various methods of treatment have been discussed with the patient and family. After consideration of risks, benefits and other options for treatment, the patient has consented to  Procedure(s): LEFT HEART CATH AND CORS/GRAFTS ANGIOGRAPHY (N/A) as a surgical intervention.  The patient's history has been reviewed, patient examined, no change in status, stable for surgery.  I have reviewed the patient's chart and labs.  Questions were answered to the patient's satisfaction.    Cath Lab Visit (complete for each Cath Lab visit)  Clinical Evaluation Leading to the Procedure:   ACS: No.  Non-ACS:    Anginal Classification: CCS III  Anti-ischemic medical therapy: Maximal Therapy (2 or more classes of medications)  Non-Invasive Test Results: No non-invasive testing performed  Prior CABG: Previous CABG  Darryl Willner

## 2022-07-07 ENCOUNTER — Encounter (HOSPITAL_COMMUNITY): Payer: Self-pay | Admitting: Internal Medicine

## 2022-07-27 ENCOUNTER — Other Ambulatory Visit: Payer: Self-pay

## 2022-07-27 ENCOUNTER — Telehealth: Payer: Self-pay

## 2022-07-27 NOTE — Telephone Encounter (Signed)
Dr. Bettina Gavia asked that instead of completing the insurance paperwork for this patient, that we call the patient to see if she is ready to return to work after her procedure. If she was ready to return to work then we could send her a letter stating that she was ok from a cardiology standpoint to return to work. I called the patient but she did not answer the phone. Left message for the patient to call back.

## 2022-07-28 ENCOUNTER — Other Ambulatory Visit: Payer: Self-pay

## 2022-07-28 ENCOUNTER — Telehealth: Payer: Self-pay

## 2022-07-28 NOTE — Telephone Encounter (Signed)
Called the patient to inform her that Dr. Munley would rather send a letter to the insurance company stating how long you need to be out of work instead of completing their forms. Patient was agreeable with this and asked that we fax the letter to the insurance company. A letter was generated stating when she could return to work and it was faxed to the insurance company. Dr. Munley also wanted to make sure she was following up with her pulmonary doctor. I informed the patient that Dr. Munley would like her to follow up with her pulmonary doctor and she was agreeable with that plan. Patient had no further questions at this time. 

## 2022-07-28 NOTE — Telephone Encounter (Signed)
Called the patient to inform her that Dr. Bettina Gavia would rather send a letter to the insurance company stating how long you need to be out of work instead of completing their forms. Patient was agreeable with this and asked that we fax the letter to the insurance company. A letter was generated stating when she could return to work and it was faxed to [employer]. Dr. Bettina Gavia also wanted to make sure she was following up with her pulmonary doctor. I informed the patient that Dr. Bettina Gavia would like her to follow up with her pulmonary doctor and she was agreeable with that plan. Patient had no further questions at this time.

## 2022-07-28 NOTE — Telephone Encounter (Signed)
Patient is returning call.  °

## 2022-08-21 NOTE — Progress Notes (Signed)
Cardiology Office Note:    Date:  08/22/2022   ID:  Chesley Mires Azeneth Carbonell, DOB [birth year], MRN [id number]  PCP:  Tamsen Snider, NP  Cardiologist:  Norman Herrlich, MD    Referring MD: Tamsen Snider, NP    ASSESSMENT:    1. SOB (shortness of breath)   2. Coronary artery disease involving native heart without angina pectoris, unspecified vessel or lesion type   3. Panlobular emphysema (HCC)   4. Essential hypertension   5. Mixed hyperlipidemia    PLAN:    In order of problems listed above:  Unfortunately despite successful CABG she continues to be very limited I think the predominant problem here is her underlying lung disease I have asked her to be seen by pulmonary again for reevaluation.  Her LVEDP was mildly elevated have never found this helpful in hemodynamic evaluation of patient's she is on a loop diuretic she is having orthostatic lightheadedness we will decrease the dose recheck her proBNP.  I will give her credit she is really pushing herself and is walking 4000 steps a day.  Perhaps pulmonary rehab could be helpful. Stable CAD patent grafts and continue medical therapy including aspirin minimum dose of beta-blocker oral nitrate ranolazine for microvascular disease and lipid-lowering with her statin. Refer back to pulmonary Continue current antihypertensives including ARB thiazide diuretic recheck renal function potassium Continue her high intensity statin we will check a lipid profile today   Next appointment: 6 months   Medication Adjustments/Labs and Tests Ordered: Current medicines are reviewed at length with the patient today.  Concerns regarding medicines are outlined above.  No orders of the defined types were placed in this encounter.  No orders of the defined types were placed in this encounter.   Chief Complaint  Patient presents with   Follow-up   Coronary Artery Disease    History of Present Illness:    [name] is  a 61 y.o. female with a hx of CAD with CABG hypertension hyperlipidemia and COPD with pan lobular emphysema last seen 06/30/2022 with marked functional limitation short of breath even with ADLs struggling to bathe.  Her follow-up echocardiogram 05/26/2022 showed normal left ventricular systolic and diastolic function and no findings of pulmonary hypertension or right ventricular dysfunction.  With ongoing severe symptoms she was referred for coronary arteriography.  Left heart cath 07/06/2022 showed mildly elevated LVEDP left thoracic artery was patent to the LAD vein graft to the third marginal branch was patent less than 30% stenosis adjacent to the valve vein graft to the right PDA had ectasia mild to moderate graft disease up to 50% nonflow limiting.  Coronary Diagrams  Diagnostic Dominance: Right  Dominance: Right  Because of her COPD she was referred to pulmonary and with her COPD was placed on Trelegy.  Compliance with diet, lifestyle and medications: Yes  Her LVEDP was mildly elevated but I have never found it helpful diagnostically she does not have heart failure and her proBNP level has been low Over all she is doing better she really struggles but gets about 4000 steps a day but finds himself short of breath and weak when she does bathing and even finds herself short of breath indoors at times and does not think that her bronchodilator has been helpful. I asked her to go back and be seen by pulmonary again. He is not having angina palpitation or syncope she does get some orthostatic lightheadedness and I will decrease her diuretic to 3 days  a week Past Medical History:  Diagnosis Date   Adrenal nodule (HCC)    Aortic heart murmur on examination    Chest pressure 11/29/2015   Dyspnea on exertion 11/29/2015   Essential hypertension 10/14/2015   Family history of coronary artery disease 11/29/2015   Fatigue 10/14/2015   Frequent UTI 10/14/2015   Generalized anxiety disorder     Insomnia, idiopathic    Left lower quadrant pain 10/14/2015   Mixed hyperlipidemia    Panlobular emphysema (HCC) 11/29/2015   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 USING LEFT INTERNAL MAMMARY ARTERY & RIGHT GREATER SAPHENOUS VEIN HARVESTED ENDOSCOPICALLY.;  Surgeon: Lovett Sox, MD;  Location: [facility];  Service: Open Heart Surgery;  Laterality: N/A;   LEFT HEART CATH AND CORONARY ANGIOGRAPHY N/A 02/08/2022   Procedure: LEFT HEART CATH AND CORONARY ANGIOGRAPHY;  Surgeon: Orbie Pyo, MD;  Location: [facility] INVASIVE CV LAB;  Service: Cardiovascular;  Laterality: N/A;   LEFT HEART CATH AND CORS/GRAFTS ANGIOGRAPHY N/A 07/06/2022   Procedure: LEFT HEART CATH AND CORS/GRAFTS ANGIOGRAPHY;  Surgeon: Yvonne Kendall, MD;  Location: [facility] INVASIVE CV LAB;  Service: Cardiovascular;  Laterality: N/A;   TEE WITHOUT CARDIOVERSION N/A 02/14/2022   Procedure: TRANSESOPHAGEAL ECHOCARDIOGRAM (TEE);  Surgeon: Lovett Sox, MD;  Location: [facility] OR;  Service: Open Heart Surgery;  Laterality: N/A;    Current Medications: Current Meds  Medication Sig   acetaminophen (TYLENOL) 500 MG tablet Take 500-1,000 mg by mouth every 6 (six) hours as needed (pain.).   aspirin EC 81 MG tablet Take 1 tablet (81 mg total) by mouth daily. Swallow whole.   atorvastatin (LIPITOR) 80 MG tablet Take 1 tablet by mouth once daily   Fluticasone-Umeclidin-Vilant (TRELEGY ELLIPTA) 200-62.5-25 MCG/ACT AEPB Inhale 1 puff into the lungs daily.   furosemide (LASIX) 40 MG tablet Take 1 tablet (40 mg total) by mouth daily.   gabapentin (NEURONTIN) 100 MG capsule Take 1 capsule (100 mg total) by mouth 2 (two) times daily.   GLUCOSAMINE-CHONDROITIN PO Take 2 tablets by mouth  in the morning.   isosorbide mononitrate (IMDUR) 30 MG 24 hr tablet Take 1 tablet by mouth once daily   metoprolol tartrate (LOPRESSOR) 25 MG tablet Take 0.5 tablets (12.5 mg total) by mouth 2 (two) times daily.   Multiple Vitamin (MULTIVITAMIN WITH MINERALS) TABS tablet Take 1 tablet by mouth in the morning.   olmesartan-hydrochlorothiazide (BENICAR HCT) 20-12.5 MG tablet Take 1 tablet by mouth daily.   Omega-3 Fatty Acids (FISH OIL) 1000 MG CAPS Take 1,000 mg by mouth in the morning.   potassium chloride (KLOR-CON) 10 MEQ tablet Take 1 tablet (10 mEq total) by mouth daily.   ranolazine (RANEXA) 500 MG 12 hr tablet Take 1 tablet (500 mg total) by mouth 2 (two) times daily.   traZODone (DESYREL) 100 MG tablet Take 100 mg by mouth at bedtime.     Allergies:   Codeine and Bismuth subsalicylate   Social History   Socioeconomic History   Marital status: Married    Spouse name: Peyton Najjar   Number of children: 2   Years of education: Not on file   Highest education level: Not on file  Occupational History   Not on  file  Tobacco Use   Smoking status: Former    Packs/day: 2.00    Types: Cigarettes    Start date: 66    Quit date: 2011    Years since quitting: 12.8    Passive exposure: Past   Smokeless tobacco: Never  Vaping Use   Vaping Use: Never used  Substance and Sexual Activity   Alcohol use: Yes    Comment: occasional   Drug use: Never   Sexual activity: Yes  Other Topics Concern   Not on file  Social History Narrative   Not on file   Social Determinants of Health   Financial Resource Strain: Not on file  Food Insecurity: Not on file  Transportation Needs: Not on file  Physical Activity: Not on file  Stress: Not on file  Social Connections: Not on file     Family History: The patient's family history includes Diabetes in her brother and brother; Diabetes type II in her mother; Heart attack in her father and mother; Hyperlipidemia in her mother; Hypertension in her  brother, father, and mother; Liver cancer in her father; Stroke in her brother. ROS:   Please see the history of present illness.    All other systems reviewed and are negative.  EKGs/Labs/Other Studies Reviewed:    The following studies were reviewed today:   Recent Labs: 02/09/2022: TSH 2.619 02/15/2022: Magnesium 2.4 05/24/2022: NT-Pro BNP 63 06/30/2022: ALT 28; BUN 16; Creatinine, Ser 0.69; Hemoglobin 15.8; Platelets 302; Potassium 5.1; Sodium 140  Recent Lipid Panel    Component Value Date/Time   CHOL 118 03/17/2022 0830   TRIG 120 03/17/2022 0830   HDL 32 (L) 03/17/2022 0830   CHOLHDL 3.7 03/17/2022 0830   LDLCALC 64 03/17/2022 0830    Physical Exam:    VS:  BP (!) 146/76 (BP Location: Left Arm, Patient Position: Sitting)   Pulse (!) 58   Ht 5\' 4"  (1.626 m)   Wt 205 lb 3.2 oz (93.1 kg)   SpO2 93%   BMI 35.22 kg/m     Wt Readings from Last 3 Encounters:  08/22/22 205 lb 3.2 oz (93.1 kg)  07/06/22 200 lb (90.7 kg)  06/30/22 200 lb 3.2 oz (90.8 kg)     GEN: She appears improved but still looks to be chronically ill as his COPD habitus well nourished, well developed in no acute distress HEENT: Normal NECK: No JVD; No carotid bruits LYMPHATICS: No lymphadenopathy CARDIAC: Diminished breath sounds but no rhonchi or wheezing RRR, no murmurs, rubs, gallops RESPIRATORY: Diminished breath sounds ABDOMEN: Soft, non-tender, non-distended MUSCULOSKELETAL:  No edema; No deformity  SKIN: Warm and dry NEUROLOGIC:  Alert and oriented x 3 PSYCHIATRIC:  Normal affect    Signed, Shirlee More, MD  08/22/2022 9:30 AM    [place]

## 2022-08-22 ENCOUNTER — Encounter: Payer: Self-pay | Admitting: Cardiology

## 2022-08-22 ENCOUNTER — Ambulatory Visit: Payer: BC Managed Care – PPO | Attending: Cardiology | Admitting: Cardiology

## 2022-08-22 VITALS — BP 124/70 | HR 58 | Ht 64.0 in | Wt 205.2 lb

## 2022-08-22 DIAGNOSIS — E782 Mixed hyperlipidemia: Secondary | ICD-10-CM

## 2022-08-22 DIAGNOSIS — I2510 Atherosclerotic heart disease of native coronary artery without angina pectoris: Secondary | ICD-10-CM | POA: Diagnosis not present

## 2022-08-22 DIAGNOSIS — R0602 Shortness of breath: Secondary | ICD-10-CM | POA: Diagnosis not present

## 2022-08-22 DIAGNOSIS — J431 Panlobular emphysema: Secondary | ICD-10-CM

## 2022-08-22 DIAGNOSIS — I10 Essential (primary) hypertension: Secondary | ICD-10-CM

## 2022-08-22 MED ORDER — FUROSEMIDE 40 MG PO TABS: ORAL_TABLET | ORAL | 3 refills | Status: DC

## 2022-08-22 NOTE — Patient Instructions (Signed)
Medication Instructions:  Your physician has recommended you make the following change in your medication:   Decrease your Lasix to 40 mg on Monday, Wednesday and Friday.  *If you need a refill on your cardiac medications before your next appointment, please call your pharmacy*   Lab Work: Your physician recommends that you have labs done in the office today. Your test included complete metabolic panel, Pro BNP and lipids.  If you have labs (blood work) drawn today and your tests are completely normal, you will receive your results only by: [place] (if you have MyChart) OR A paper copy in the mail If you have any lab test that is abnormal or we need to change your treatment, we will call you to review the results.   Testing/Procedures: None ordered   Follow-Up: At [facility], you and your health needs are our priority.  As part of our continuing mission to provide you with exceptional heart care, we have created designated Provider Care Teams.  These Care Teams include your primary Cardiologist (physician) and Advanced Practice Providers (APPs -  Physician Assistants and Nurse Practitioners) who all work together to provide you with the care you need, when you need it.  We recommend signing up for the patient portal called "MyChart".  Sign up information is provided on this After Visit Summary.  MyChart is used to connect with patients for Virtual Visits (Telemedicine).  Patients are able to view lab/test results, encounter notes, upcoming appointments, etc.  Non-urgent messages can be sent to your provider as well.   To learn more about what you can do with MyChart, go to [email or web address].    Your next appointment:   12 month(s)  The format for your next appointment:   In Person  Provider:   Shirlee More, MD   Other Instructions NA

## 2022-08-24 LAB — COMPREHENSIVE METABOLIC PANEL
ALT: 24 IU/L (ref 0–32)
AST: 24 IU/L (ref 0–40)
Albumin/Globulin Ratio: 2 (ref 1.2–2.2)
Albumin: 4.7 g/dL (ref 3.8–4.9)
Alkaline Phosphatase: 75 IU/L (ref 44–121)
BUN/Creatinine Ratio: 29 — ABNORMAL HIGH (ref 12–28)
BUN: 19 mg/dL (ref 8–27)
Bilirubin Total: <0.2 mg/dL (ref 0.0–1.2)
CO2: 24 mmol/L (ref 20–29)
Calcium: 9.5 mg/dL (ref 8.7–10.3)
Chloride: 103 mmol/L (ref 96–106)
Creatinine, Ser: 0.66 mg/dL (ref 0.57–1.00)
Globulin, Total: 2.4 g/dL (ref 1.5–4.5)
Glucose: 111 mg/dL — ABNORMAL HIGH (ref 70–99)
Potassium: 4.4 mmol/L (ref 3.5–5.2)
Sodium: 143 mmol/L (ref 134–144)
Total Protein: 7.1 g/dL (ref 6.0–8.5)
eGFR: 100 mL/min/{1.73_m2} (ref >59)

## 2022-08-24 LAB — PRO B NATRIURETIC PEPTIDE: NT-Pro BNP: 69 pg/mL (ref 0–287)

## 2022-08-24 LAB — LIPID PANEL
Chol/HDL Ratio: 3.6 ratio (ref 0.0–4.4)
Cholesterol, Total: 187 mg/dL (ref 100–199)
HDL: 52 mg/dL (ref >39)
LDL Chol Calc (NIH): 110 mg/dL — ABNORMAL HIGH (ref 0–99)
Triglycerides: 139 mg/dL (ref 0–149)
VLDL Cholesterol Cal: 25 mg/dL (ref 5–40)

## 2022-09-08 ENCOUNTER — Other Ambulatory Visit: Payer: Self-pay | Admitting: Pulmonary Disease

## 2022-09-11 ENCOUNTER — Ambulatory Visit (INDEPENDENT_AMBULATORY_CARE_PROVIDER_SITE_OTHER): Payer: BC Managed Care – PPO | Admitting: Pulmonary Disease

## 2022-09-11 ENCOUNTER — Other Ambulatory Visit: Payer: Self-pay

## 2022-09-11 ENCOUNTER — Emergency Department (HOSPITAL_COMMUNITY): Payer: BC Managed Care – PPO

## 2022-09-11 ENCOUNTER — Encounter: Payer: Self-pay | Admitting: Pulmonary Disease

## 2022-09-11 ENCOUNTER — Emergency Department (HOSPITAL_COMMUNITY)
Admission: EM | Admit: 2022-09-11 | Discharge: 2022-09-11 | Discharge status: Against Medical Advice | Payer: BC Managed Care – PPO | Attending: Physician Assistant | Admitting: Physician Assistant

## 2022-09-11 VITALS — BP 90/60 | HR 74 | Ht 64.0 in | Wt 205.4 lb

## 2022-09-11 DIAGNOSIS — R55 Syncope and collapse: Secondary | ICD-10-CM | POA: Diagnosis not present

## 2022-09-11 DIAGNOSIS — R0602 Shortness of breath: Secondary | ICD-10-CM | POA: Insufficient documentation

## 2022-09-11 DIAGNOSIS — R0609 Other forms of dyspnea: Secondary | ICD-10-CM | POA: Diagnosis not present

## 2022-09-11 DIAGNOSIS — J431 Panlobular emphysema: Secondary | ICD-10-CM

## 2022-09-11 DIAGNOSIS — Z5321 Procedure and treatment not carried out due to patient leaving prior to being seen by health care provider: Secondary | ICD-10-CM | POA: Diagnosis not present

## 2022-09-11 LAB — CBC WITH DIFFERENTIAL/PLATELET
Abs Immature Granulocytes: 0.03 10*3/uL (ref 0.00–0.07)
Basophils Absolute: 0 10*3/uL (ref 0.0–0.1)
Basophils Relative: 0 %
Eosinophils Absolute: 0.1 10*3/uL (ref 0.0–0.5)
Eosinophils Relative: 1 %
HCT: 45.8 % (ref 36.0–46.0)
Hemoglobin: 15.5 g/dL — ABNORMAL HIGH (ref 12.0–15.0)
Immature Granulocytes: 0 %
Lymphocytes Relative: 28 %
Lymphs Abs: 2.2 10*3/uL (ref 0.7–4.0)
MCH: 32.9 pg (ref 26.0–34.0)
MCHC: 33.8 g/dL (ref 30.0–36.0)
MCV: 97.2 fL (ref 80.0–100.0)
Monocytes Absolute: 0.7 10*3/uL (ref 0.1–1.0)
Monocytes Relative: 10 %
Neutro Abs: 4.6 10*3/uL (ref 1.7–7.7)
Neutrophils Relative %: 61 %
Platelets: 331 10*3/uL (ref 150–400)
RBC: 4.71 MIL/uL (ref 3.87–5.11)
RDW: 12 % (ref 11.5–15.5)
WBC: 7.7 10*3/uL (ref 4.0–10.5)
nRBC: 0 % (ref 0.0–0.2)

## 2022-09-11 LAB — COMPREHENSIVE METABOLIC PANEL
ALT: 29 U/L (ref 0–44)
AST: 30 U/L (ref 15–41)
Albumin: 4 g/dL (ref 3.5–5.0)
Alkaline Phosphatase: 61 U/L (ref 38–126)
Anion gap: 11 (ref 5–15)
BUN: 23 mg/dL — ABNORMAL HIGH (ref 6–20)
CO2: 26 mmol/L (ref 22–32)
Calcium: 9.7 mg/dL (ref 8.9–10.3)
Chloride: 104 mmol/L (ref 98–111)
Creatinine, Ser: 0.92 mg/dL (ref 0.44–1.00)
GFR, Estimated: >60 mL/min (ref >60)
Glucose, Bld: 111 mg/dL — ABNORMAL HIGH (ref 70–99)
Potassium: 4 mmol/L (ref 3.5–5.1)
Sodium: 141 mmol/L (ref 135–145)
Total Bilirubin: 0.2 mg/dL — ABNORMAL LOW (ref 0.3–1.2)
Total Protein: 6.6 g/dL (ref 6.5–8.1)

## 2022-09-11 LAB — TROPONIN I (HIGH SENSITIVITY): Troponin I (High Sensitivity): 7 ng/L (ref <18)

## 2022-09-11 LAB — BRAIN NATRIURETIC PEPTIDE: B Natriuretic Peptide: 20.6 pg/mL (ref 0.0–100.0)

## 2022-09-11 MED ORDER — IOHEXOL 350 MG/ML SOLN
75.0000 mL | Freq: Once | INTRAVENOUS | Status: AC | PRN
  Administered 2022-09-11: 75 mL via INTRAVENOUS

## 2022-09-11 NOTE — ED Notes (Signed)
Pt stated that she no longer wanted to wait  

## 2022-09-11 NOTE — ED Provider Triage Note (Signed)
Emergency Medicine Provider Triage Evaluation Note  [facility] [name] , a 61 y.o. female  was evaluated in triage.  Pt complains of shortness of breath.  Pt sent by pulmonary Md for evaltuion of copd vs PE.  Pulmonary advised ct angio chest  Review of Systems  Positive: fatigue Negative: fever  Physical Exam  BP 96/63 (BP Location: Right Arm)   Pulse 69   Temp (!) 97.5 F (36.4 C) (Oral)   Resp 16   SpO2 91%  Gen:   Awake, no distress   Resp:  Normal effort  MSK:   Moves extremities without difficulty  Other:    Medical Decision Making  Medically screening exam initiated at 1:10 PM.  Appropriate orders placed.  [employer] Keerthi Hazell was informed that the remainder of the evaluation will be completed by another provider, this initial triage assessment does not replace that evaluation, and the importance of remaining in the ED until their evaluation is complete.     Elson Areas, [place] 09/11/22 1311

## 2022-09-11 NOTE — ED Triage Notes (Signed)
Pt at routine pulmonary appointment, evaluating chronic DOE s/p CABG several months ago. While at office patient became near syncopal, drop in bp to 90/60. Some concern for PE from office.

## 2022-09-11 NOTE — Progress Notes (Signed)
[address] [name]    [id number]    17-Oct-1961  Primary Care Physician:Ferrell, Buford Dresser, NP  Referring Physician: Tamsen Snider, NP [address] [place],  [place] [address]  Chief complaint: Follow-up for emphysema.  HPI: 61 y.o. with history of coronary artery disease, hypertension, emphysema Referred for evaluation of dyspnea, desats on exertion at rehab She recently had a heart catheterization with triple-vessel disease and underwent CABG in April 2023.  Post CABG she attended rehab and was noted to have desats which improved over time.  Not currently on any inhalers.  No cough, sputum production, dyspnea  Pets: Dog, cat Occupation: Armed forces technical officer at a [employer] Exposures: No mold, hot tub, Jacuzzi.  No feather pillows or comforters Smoking history: 40-pack-year smoker.  Quit in 2011 Travel history: No significant travel history Relevant family history: No family history of lung disease  Interim history: Continues to have significant dyspnea on exertion.  Had a right and heart left heart catheterization in September 2023 with findings of coronary artery disease, patent grafts.  Referred back to pulmonary for reevaluation.  Continues on Trelegy inhaler  In office today she is complaining of sudden onset of dyspnea, left neck and chest pain, fatigue.  She appears pale and diaphoretic.  Outpatient Encounter Medications as of 09/11/2022  Medication Sig   Fluticasone-Umeclidin-Vilant (TRELEGY ELLIPTA) 200-62.5-25 MCG/ACT AEPB Inhale 1 puff by mouth once daily   acetaminophen (TYLENOL) 500 MG tablet Take 500-1,000 mg by mouth every 6 (six) hours as needed (pain.).   aspirin EC 81 MG tablet Take 1 tablet (81 mg total) by mouth daily. Swallow whole.   atorvastatin (LIPITOR) 80 MG tablet Take 1 tablet by mouth once daily   furosemide (LASIX) 40 MG tablet Take 40 mg (1 tablet) on Monday, Wednesday and Friday.   gabapentin (NEURONTIN) 100 MG  capsule Take 1 capsule (100 mg total) by mouth 2 (two) times daily.   GLUCOSAMINE-CHONDROITIN PO Take 2 tablets by mouth in the morning.   isosorbide mononitrate (IMDUR) 30 MG 24 hr tablet Take 1 tablet by mouth once daily   metoprolol tartrate (LOPRESSOR) 25 MG tablet Take 0.5 tablets (12.5 mg total) by mouth 2 (two) times daily.   Multiple Vitamin (MULTIVITAMIN WITH MINERALS) TABS tablet Take 1 tablet by mouth in the morning.   olmesartan-hydrochlorothiazide (BENICAR HCT) 20-12.5 MG tablet Take 1 tablet by mouth daily.   Omega-3 Fatty Acids (FISH OIL) 1000 MG CAPS Take 1,000 mg by mouth in the morning.   potassium chloride (KLOR-CON) 10 MEQ tablet Take 1 tablet (10 mEq total) by mouth daily.   ranolazine (RANEXA) 500 MG 12 hr tablet Take 1 tablet (500 mg total) by mouth 2 (two) times daily.   traZODone (DESYREL) 100 MG tablet Take 100 mg by mouth at bedtime.   No facility-administered encounter medications on file as of 09/11/2022.    Physical Exam: Blood pressure 90/60, pulse 74, height 5\' 4"  (1.626 m), weight 205 lb 6.4 oz (93.2 kg), SpO2 94 %. Gen:      Moderate distress HEENT:  EOMI, sclera anicteric Neck:     No masses; no thyromegaly Lungs:    Clear to auscultation bilaterally; normal respiratory effort CV:         Regular rate and rhythm; no murmurs Abd:      + bowel sounds; soft, non-tender; no palpable masses, no distension Ext:    No edema; adequate peripheral perfusion Skin:  Warm and dry; no rash Neuro: alert and oriented x 3 Psych: normal mood and affect   Data Reviewed: Imaging: CTA 12/01/2021 Emphysematous changes, no PE, bibasal atelectasis  CT abdomen 01/11/2022 Mild dependent subsegmental atelectasis.  CT scan at [facility] 04/25/22 reviewed No acute airspace disease or interstitial lung disease, postsurgical changes of CABG, aortic atherosclerosis and emphysema.  I reviewed the images personally.  PFTs: 02/13/2022 FVC 3.98 [120%], FEV1 3.11 [121%], F/F 78,  TLC 6.84 [135%], DLCO 20.62 [101%] Hyperinflation and air trapping  Labs:  Assessment:  Acute visit Appears pale diaphoretic and hypotensive in the ED.  She tells me that the symptoms came on suddenly BP is low at 90/60. We will send her to the emergency room for evaluation.  May need a CT angiogram for pulmonary embolism as her dyspnea on exertion is out of proportion to cardiac and COPD findings  Emphysema No significant obstruction noted on PFTs.  Which she continues to be symptomatic with significant dyspnea on exertion.  We will give her a sample of Trelegy 200 and send in a prescription to see if it helps  High-resolution CT reviewed with no significant interstitial lung disease.  Refer for low-dose screening CT to start in 2024  Suspected OSA She had a sleep study around January 2023 from a primary care which was nondiagnostic.  She may need to in lab study and her primary care is working on this.  Plan/Recommendations: ED eval. May need CTA Continue trelegy  Chilton Greathouse MD [facility] 09/11/2022, 10:46 AM  CC: Tamsen Snider, NP

## 2022-10-11 ENCOUNTER — Telehealth: Payer: Self-pay | Admitting: Cardiology

## 2022-10-11 ENCOUNTER — Telehealth: Payer: Self-pay

## 2022-10-11 NOTE — Telephone Encounter (Signed)
I will forward phone note to pre op as to review if the pt needs pre med prior to procedure. See clearance notes in the chart from today.

## 2022-10-11 NOTE — Telephone Encounter (Signed)
[place] [place] [facility] calling back after receiving clearance. She states they need to know if pt needs to premedicate prior to his surgery.

## 2022-10-11 NOTE — Telephone Encounter (Signed)
   Pre-operative Risk Assessment    Patient Name: [name]  DOB: 03/27/1961 MRN: [id number]      Request for Surgical Clearance{  Procedure:   EGD  2. When is this surgery scheduled? Press F2 to enter date below and place date in Reason for Visit (see directions below). :1} Date of Surgery:  Clearance TBD                                 Surgeon:  Dr. Tollie Pizza Misenhelmer Surgeon's Group or Practice Name:  [facility] Phone number:  [phone] Fax number:  [phone]   Type of Clearance Requested:   - Pharmacy:  Hold Aspirin Aspirin   Type of Anesthesia:  Not Indicated   Additional requests/questions:  Please fax a copy of completed and signed request form  to the surgeon's office.  Kathie Dike   10/11/2022, 12:08 PM

## 2022-10-11 NOTE — Telephone Encounter (Signed)
   Patient Name: [name]  DOB: [birth year] MRN: [id number]  Primary Cardiologist: None  Chart reviewed as part of pre-operative protocol coverage. Pharmacy clearance only. Per office protocols, [name]   S/p CABG 01/2022. Cath 07/06/22 with known moderate to severe multivessel CAD with small but patent LIMA-LAD recommended for continued medical therapy.   Per office protocol may hold Aspirin 5-7 days prior to planned procedure.   I will route this recommendation to the requesting party via Epic fax function and remove from pre-op pool.  Please call with questions.  Alver Sorrow, NP 10/11/2022, 12:15 PM

## 2022-10-12 ENCOUNTER — Telehealth: Payer: Self-pay | Admitting: Pulmonary Disease

## 2022-10-12 NOTE — Telephone Encounter (Signed)
Received a FMLA/disability form from [employer] - I called patient and left voice message.  We need to know if she is or is not working, when she stopped working and her anticipated return to work date.  Also need to know if there are any work restrictions she needs to have documented.  Last form completed by Dr. Isaiah Serge on 06/02/22 stated she was able to return to work with no restrictions.  There are nothing in her 09/11/22 office notes indicating she is not working.  I will wait to hear from patient before completing this form.

## 2022-10-12 NOTE — Telephone Encounter (Signed)
Received a request from [employer] for office notes since 05/19/22.   Faxed notes from 06/19/22 and 09/11/22 to fax# [phone]

## 2022-10-13 NOTE — Telephone Encounter (Signed)
I tried to call the requesting office, though they are closed on Friday's. I believe the question from requesting office is the pt is going to need antibiotic. I will have to call back on Monday. I will send FYI note as well.       Note   You  [facility] [phone] minutes ago (12:14 PM)   Sharlene Dory, PA-C  You2 days ago    Premedicate with like pain medication?   You routed conversation to [employer] days ago   You2 days ago    I will forward phone note to pre op as to review if the pt needs pre med prior to procedure. See clearance notes in the chart from today.       Note   Terese Door R routed conversation to [employer] days ago   [place], Devon R2 days ago   CM [facility] [facility] calling back after receiving clearance. She states they need to know if pt needs to premedicate prior to his surgery.       Note   [facility] [phone]  [place] Gleason

## 2022-10-13 NOTE — Telephone Encounter (Signed)
I tried to call the requesting office, though they are closed on Friday's. I believe the question from requesting office is the pt is going to need antibiotic. I will have to call back on Monday. I will send FYI note as well.

## 2022-10-18 ENCOUNTER — Telehealth: Payer: Self-pay | Admitting: Cardiology

## 2022-10-18 NOTE — Telephone Encounter (Signed)
Patient does not need to be premedicated with antibiotics prior to EGD from a cardiac standpoint. Will route this message thread back to requesting office.   Corrin Parker, PA-C 10/18/2022 12:43 PM

## 2022-10-18 NOTE — Telephone Encounter (Signed)
See notes from [name] on clearance. requesting office was asking if the pt is going to need ABX

## 2022-10-18 NOTE — Telephone Encounter (Signed)
I tried to call the requesting office as well to let them know that we have faxed new notes to clarify the pt does not need ABX. I left a vm as recording states the office is closed. I will fax notes again with the notes that I left a message as well on their vm.

## 2022-10-19 ENCOUNTER — Telehealth: Payer: Self-pay | Admitting: Pulmonary Disease

## 2022-10-19 NOTE — Telephone Encounter (Signed)
When I attempted to call the number that you provided it was a 61 year old lady that andered name [name]. She states nobody by the name of Sal lived there. I do not see any referral what so ever to [facility] for this patient. If they call back can you make sure you get the number to call them back and a fax number for Sal.   Thank you

## 2022-10-19 NOTE — Telephone Encounter (Signed)
Rehab calling for PFT (03/2022) results and latest face to face notes with Dr. Isaiah Serge. His name was [name] ask for him.

## 2022-10-24 NOTE — Telephone Encounter (Signed)
Nothing mentioned about a referral to rehab in any of pt's recent visits. Due to this, closing encounter.

## 2022-11-15 NOTE — Telephone Encounter (Signed)
Received a 2nd request from [employer] for [employer] on patient's continued disability.   I called and left another voice message for patient - need to know if she has returned to work and whether we need to fill this form out or not.   Provided her my direct phone number to call back.

## 2022-12-23 ENCOUNTER — Other Ambulatory Visit: Payer: Self-pay | Admitting: Internal Medicine

## 2022-12-25 NOTE — Telephone Encounter (Signed)
Please review for refill. Thank you! 

## 2023-01-29 ENCOUNTER — Other Ambulatory Visit: Payer: Self-pay | Admitting: Internal Medicine

## 2023-01-29 NOTE — Telephone Encounter (Signed)
Rx refill sent to pharmacy. 

## 2023-01-29 NOTE — Telephone Encounter (Signed)
Refill request

## 2023-03-01 ENCOUNTER — Other Ambulatory Visit: Payer: Self-pay | Admitting: Cardiology

## 2023-03-24 ENCOUNTER — Other Ambulatory Visit: Payer: Self-pay | Admitting: Cardiology

## 2023-05-10 ENCOUNTER — Other Ambulatory Visit: Payer: Self-pay | Admitting: Cardiology

## 2023-05-24 ENCOUNTER — Other Ambulatory Visit: Payer: Self-pay | Admitting: Cardiology

## 2023-05-24 DIAGNOSIS — R0602 Shortness of breath: Secondary | ICD-10-CM

## 2023-05-24 DIAGNOSIS — I2510 Atherosclerotic heart disease of native coronary artery without angina pectoris: Secondary | ICD-10-CM

## 2023-05-29 ENCOUNTER — Other Ambulatory Visit: Payer: Self-pay | Admitting: Cardiology

## 2023-06-01 ENCOUNTER — Other Ambulatory Visit: Payer: Self-pay

## 2023-06-01 DIAGNOSIS — I2510 Atherosclerotic heart disease of native coronary artery without angina pectoris: Secondary | ICD-10-CM

## 2023-06-01 DIAGNOSIS — R0602 Shortness of breath: Secondary | ICD-10-CM

## 2023-06-01 MED ORDER — POTASSIUM CHLORIDE ER 10 MEQ PO TBCR: 10.0000 meq | EXTENDED_RELEASE_TABLET | Freq: Every day | ORAL | 1 refills | Status: AC

## 2023-06-01 MED ORDER — FUROSEMIDE 40 MG PO TABS: ORAL_TABLET | ORAL | 1 refills | Status: AC

## 2023-06-10 ENCOUNTER — Other Ambulatory Visit: Payer: Self-pay | Admitting: Cardiology

## 2023-06-14 ENCOUNTER — Other Ambulatory Visit: Payer: Self-pay | Admitting: Cardiology

## 2023-06-30 ENCOUNTER — Other Ambulatory Visit: Payer: Self-pay | Admitting: Cardiology

## 2023-07-04 ENCOUNTER — Other Ambulatory Visit: Payer: Self-pay | Admitting: Cardiology

## 2023-07-06 ENCOUNTER — Other Ambulatory Visit: Payer: Self-pay | Admitting: Cardiology

## 2023-07-21 IMAGING — DX DG CHEST 1V PORT
1 series · 1 of 1 positions shown · non-contrast
Comparison: Prior chest radiographs 02/14/2022 and earlier.

CLINICAL DATA: Provided history: Status post CABG x3. History of
heart murmur, panlobular emphysema.

EXAM:
PORTABLE CHEST 1 VIEW

[chest ap]
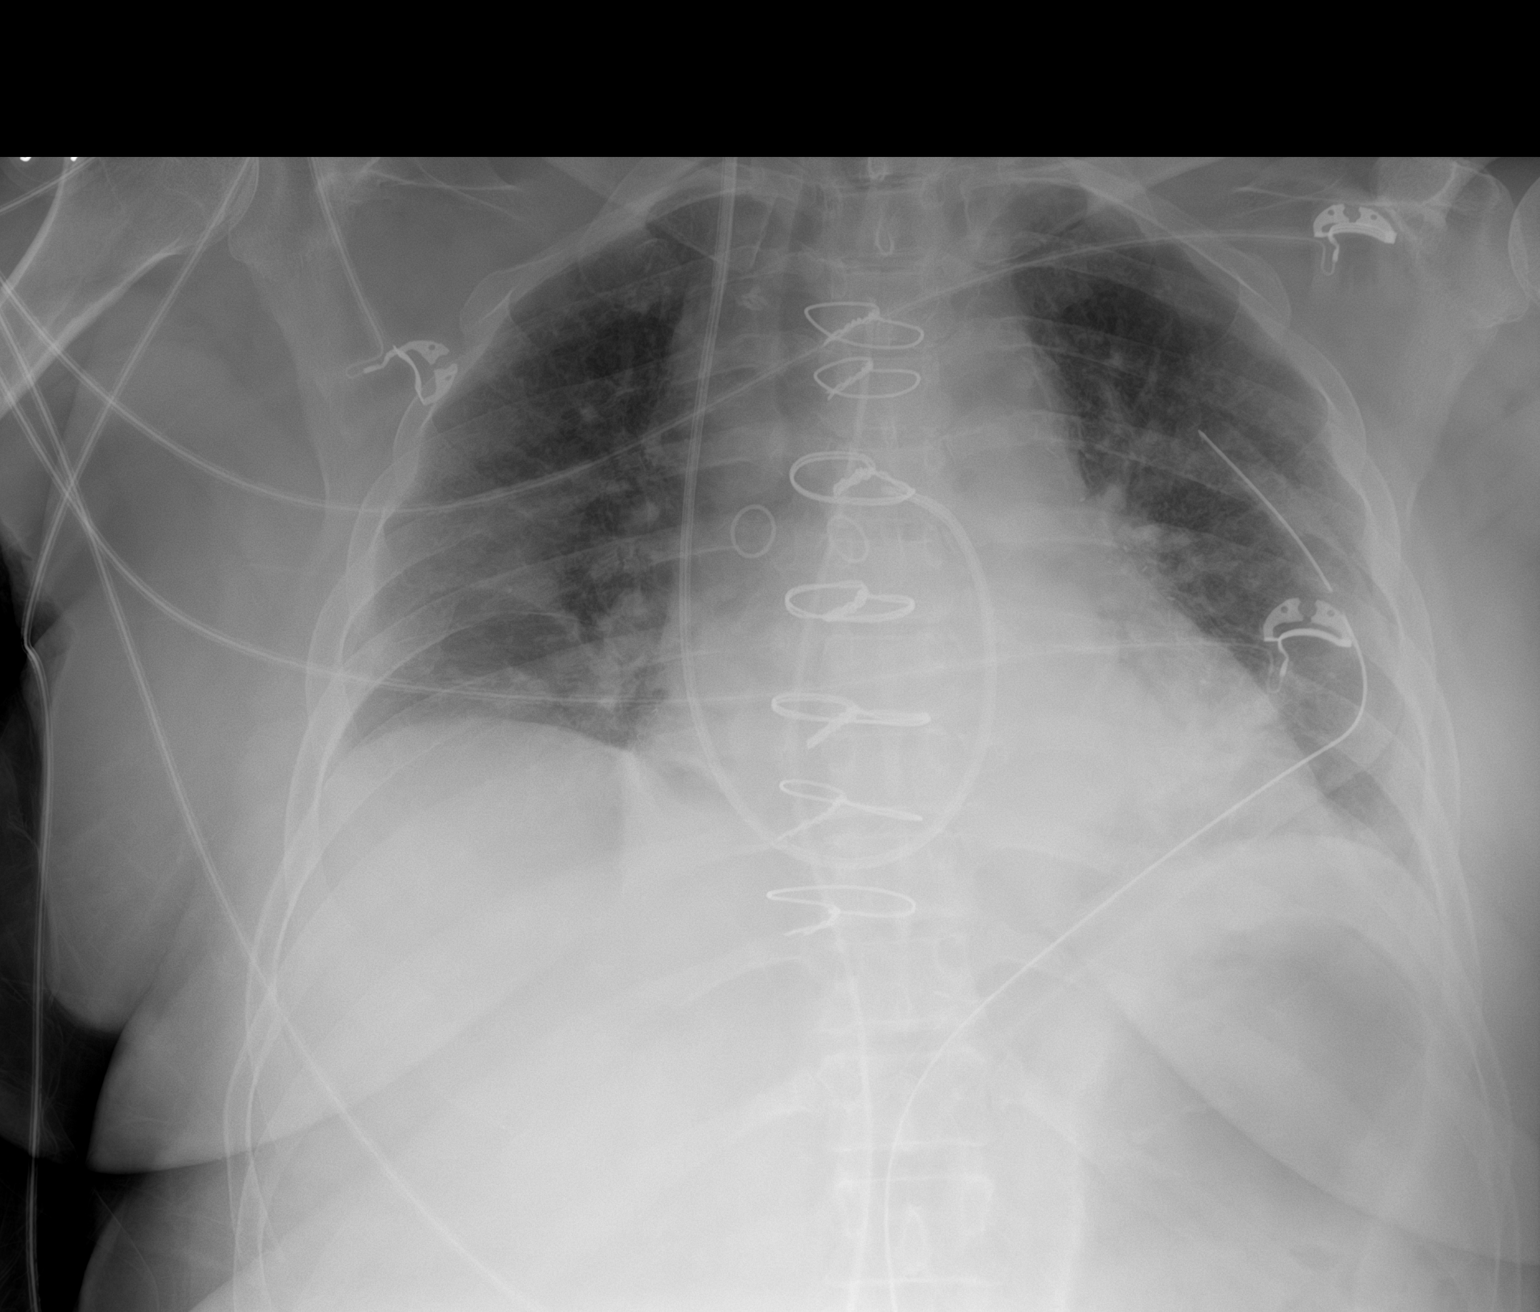

[1 of 1 positions shown; findings below may reference images not displayed]

FINDINGS: Prior median sternotomy and CABG. Interval extubation. Interval
removal of a previously demonstrated enteric tube. Unchanged
position of a right IJ approach Swan-Ganz catheter with tip
projecting in the region of the pulmonary trunk or right main
pulmonary artery. Unchanged position of two chest tubes.

Heart size at the upper limits of normal (when accounting for a
shallow inspiration AP radiograph). Mild patchy opacity within the
bilateral lung bases, likely reflecting atelectasis. No evidence of
pleural effusion or pneumothorax.
IMPRESSION: Positions of support lines and tubes, as described.

Persistent mild patchy opacity within the bilateral lung bases,
likely reflecting atelectasis.

## 2023-07-22 IMAGING — DX DG CHEST 1V PORT
1 series · 1 of 1 positions shown · non-contrast
Comparison: February 05, 2022.

CLINICAL DATA: Status post coronary bypass graft.

EXAM:
PORTABLE CHEST 1 VIEW

[chest]
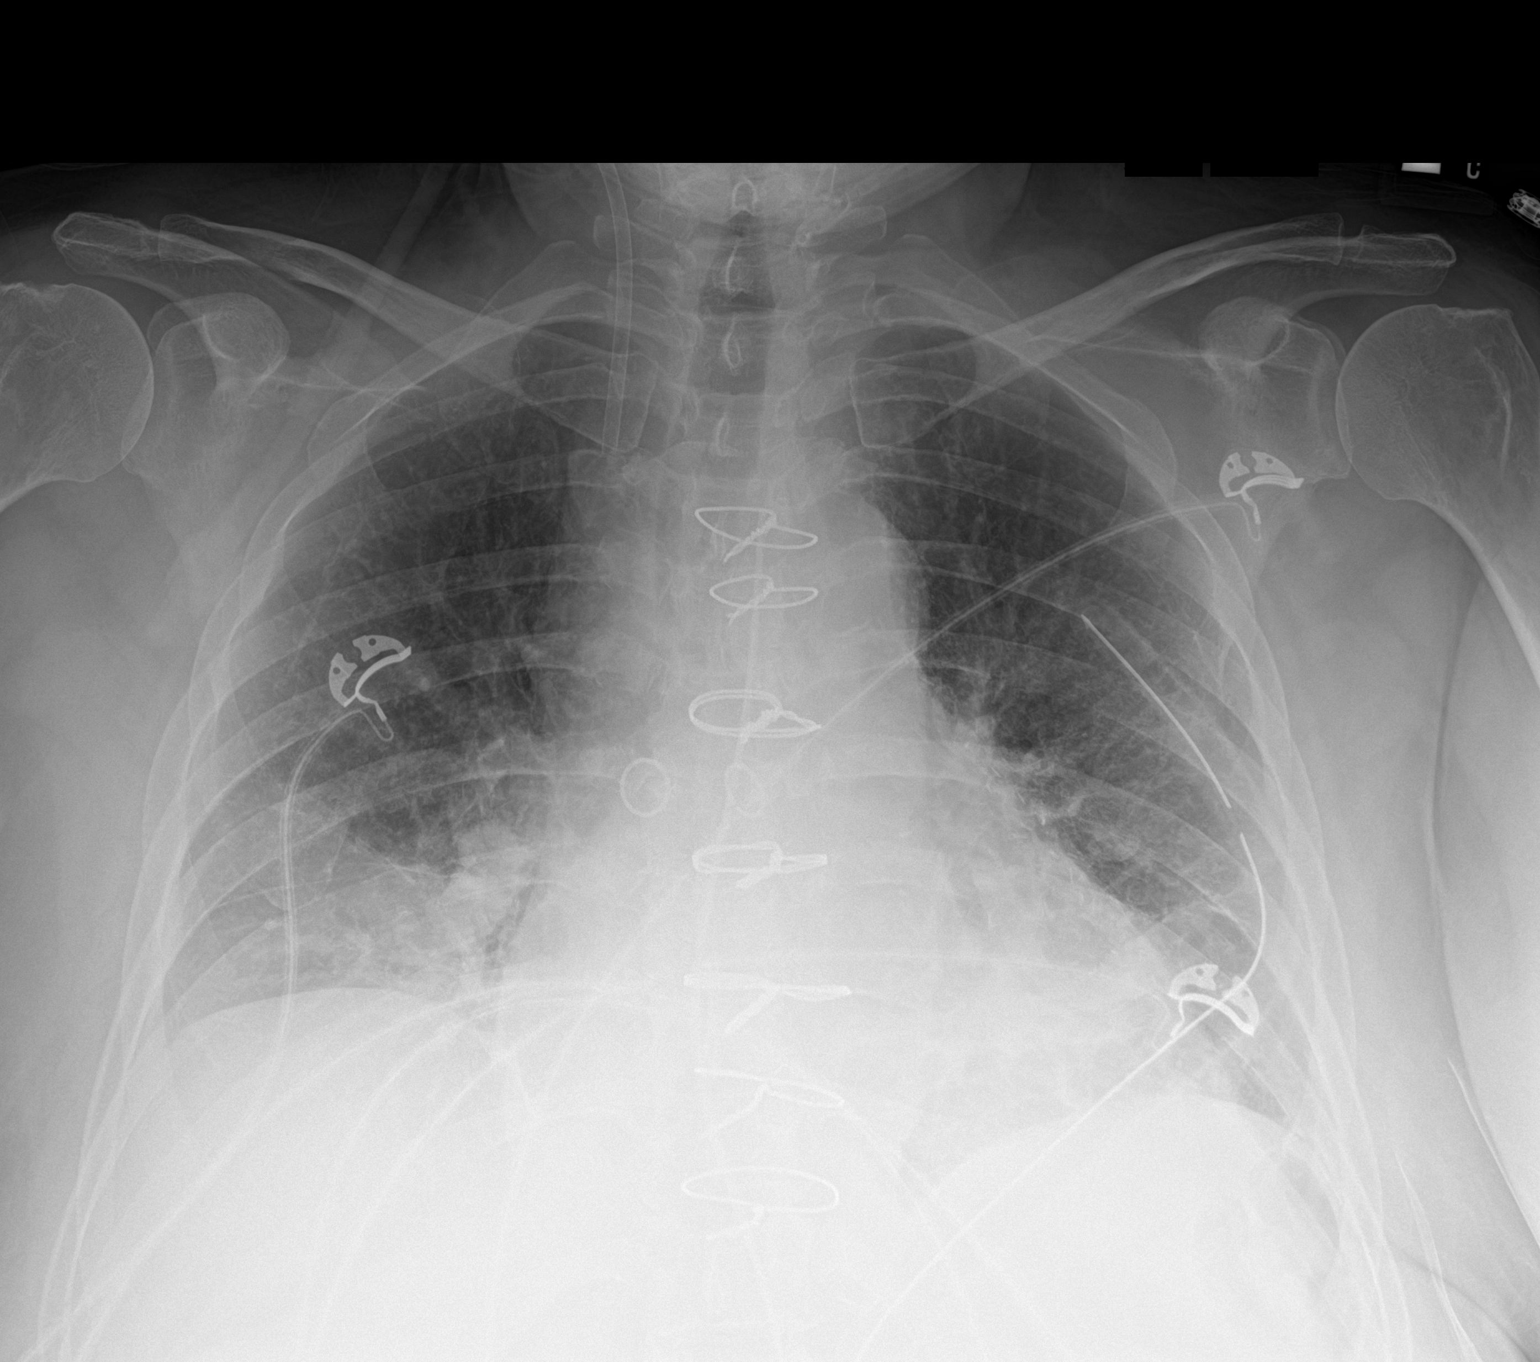

[1 of 1 positions shown; findings below may reference images not displayed]

FINDINGS: Stable cardiomediastinal silhouette. Stable position of left-sided
chest tube without pneumothorax. Right basilar subsegmental
atelectasis is noted with probable small right pleural effusion.
Swan-Ganz catheter has been removed. Bony thorax is unremarkable.
IMPRESSION: Stable left-sided chest tube without pneumothorax. Mild right
basilar atelectasis is noted with probable right pleural effusion.

## 2023-07-23 IMAGING — CR DG CHEST 2V
2 series · 2 of 2 positions shown · non-contrast
Comparison: Portable chest yesterday at [DATE] a.m.

CLINICAL DATA: Postoperative chest evaluation.

EXAM:
CHEST - 2 VIEW

[chest pa]
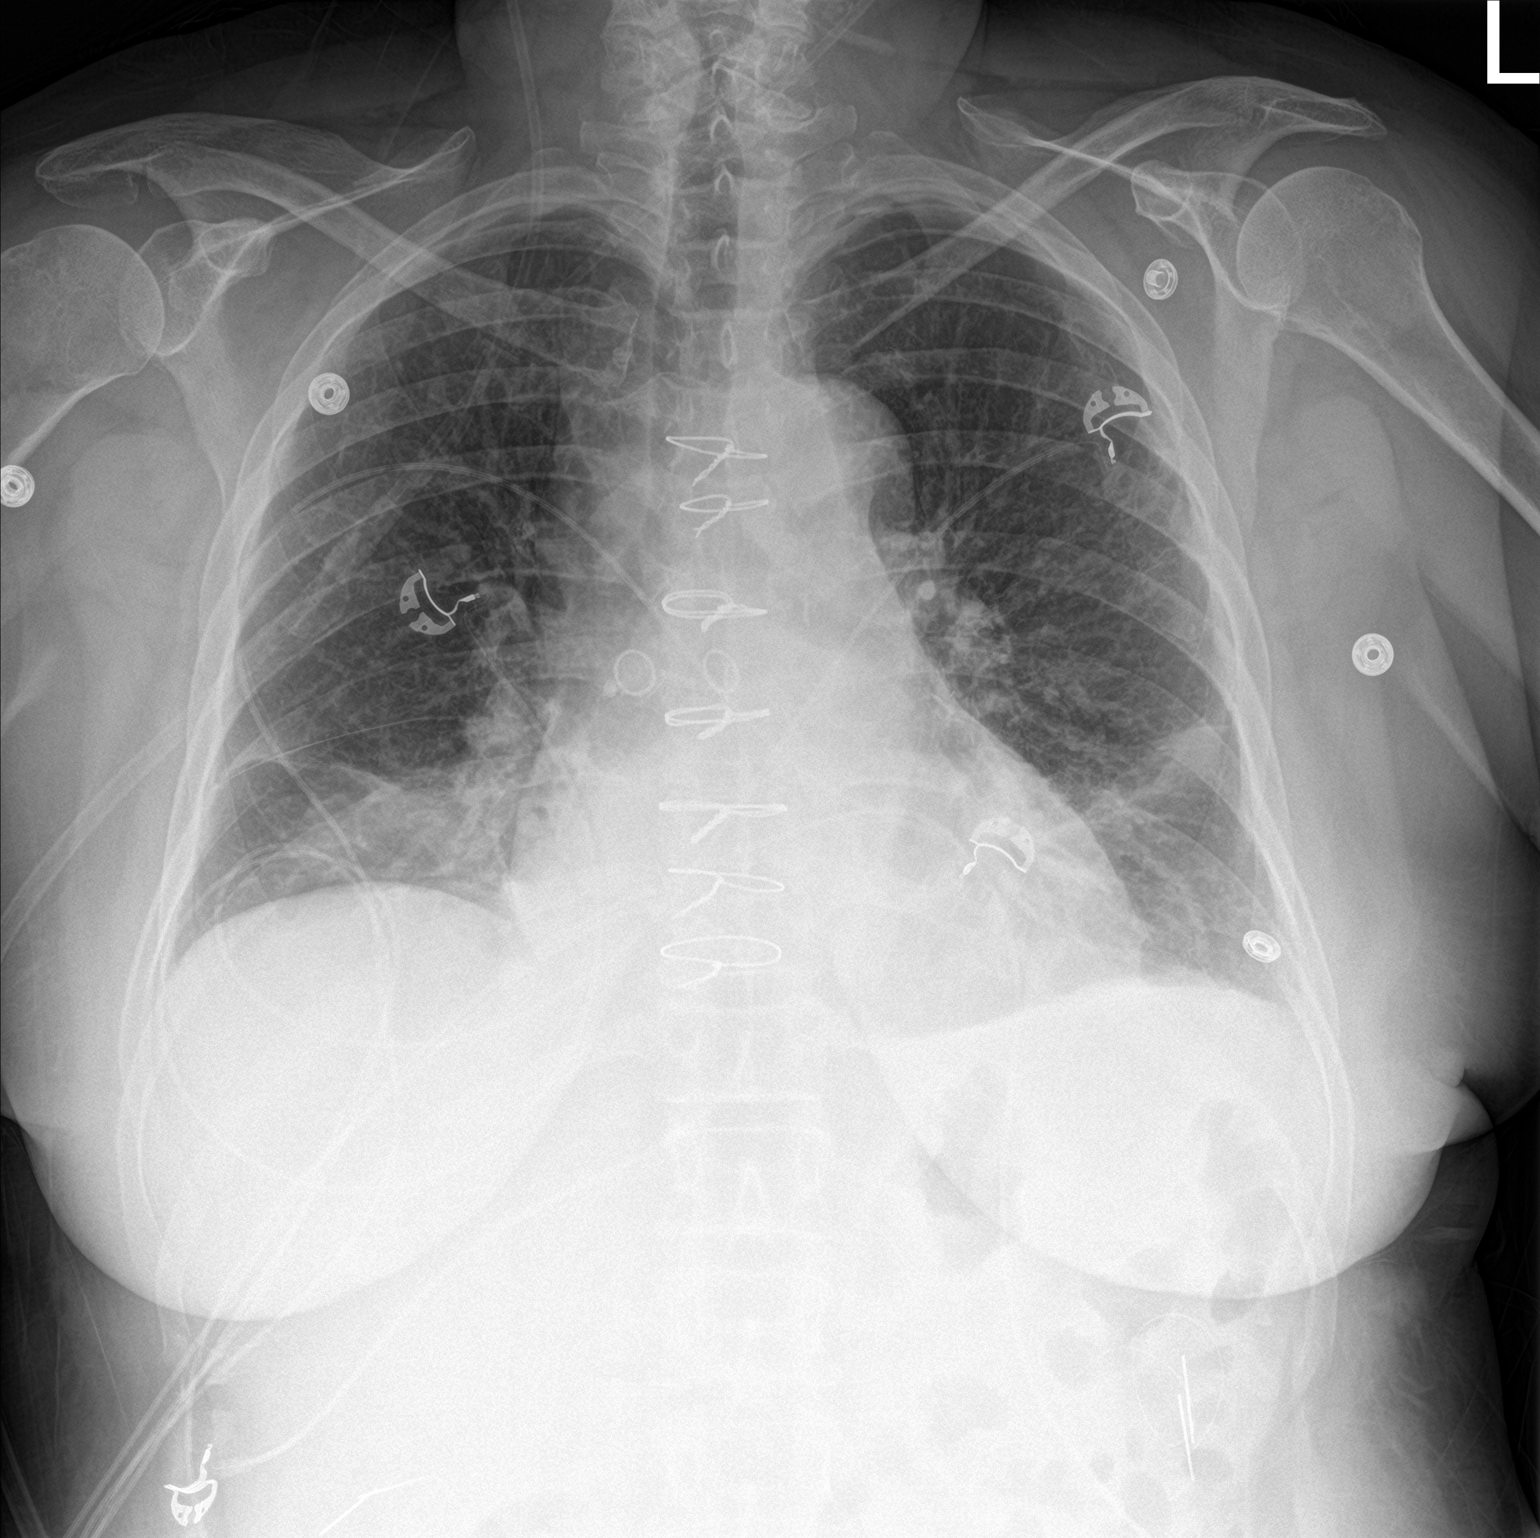

[chest lat]
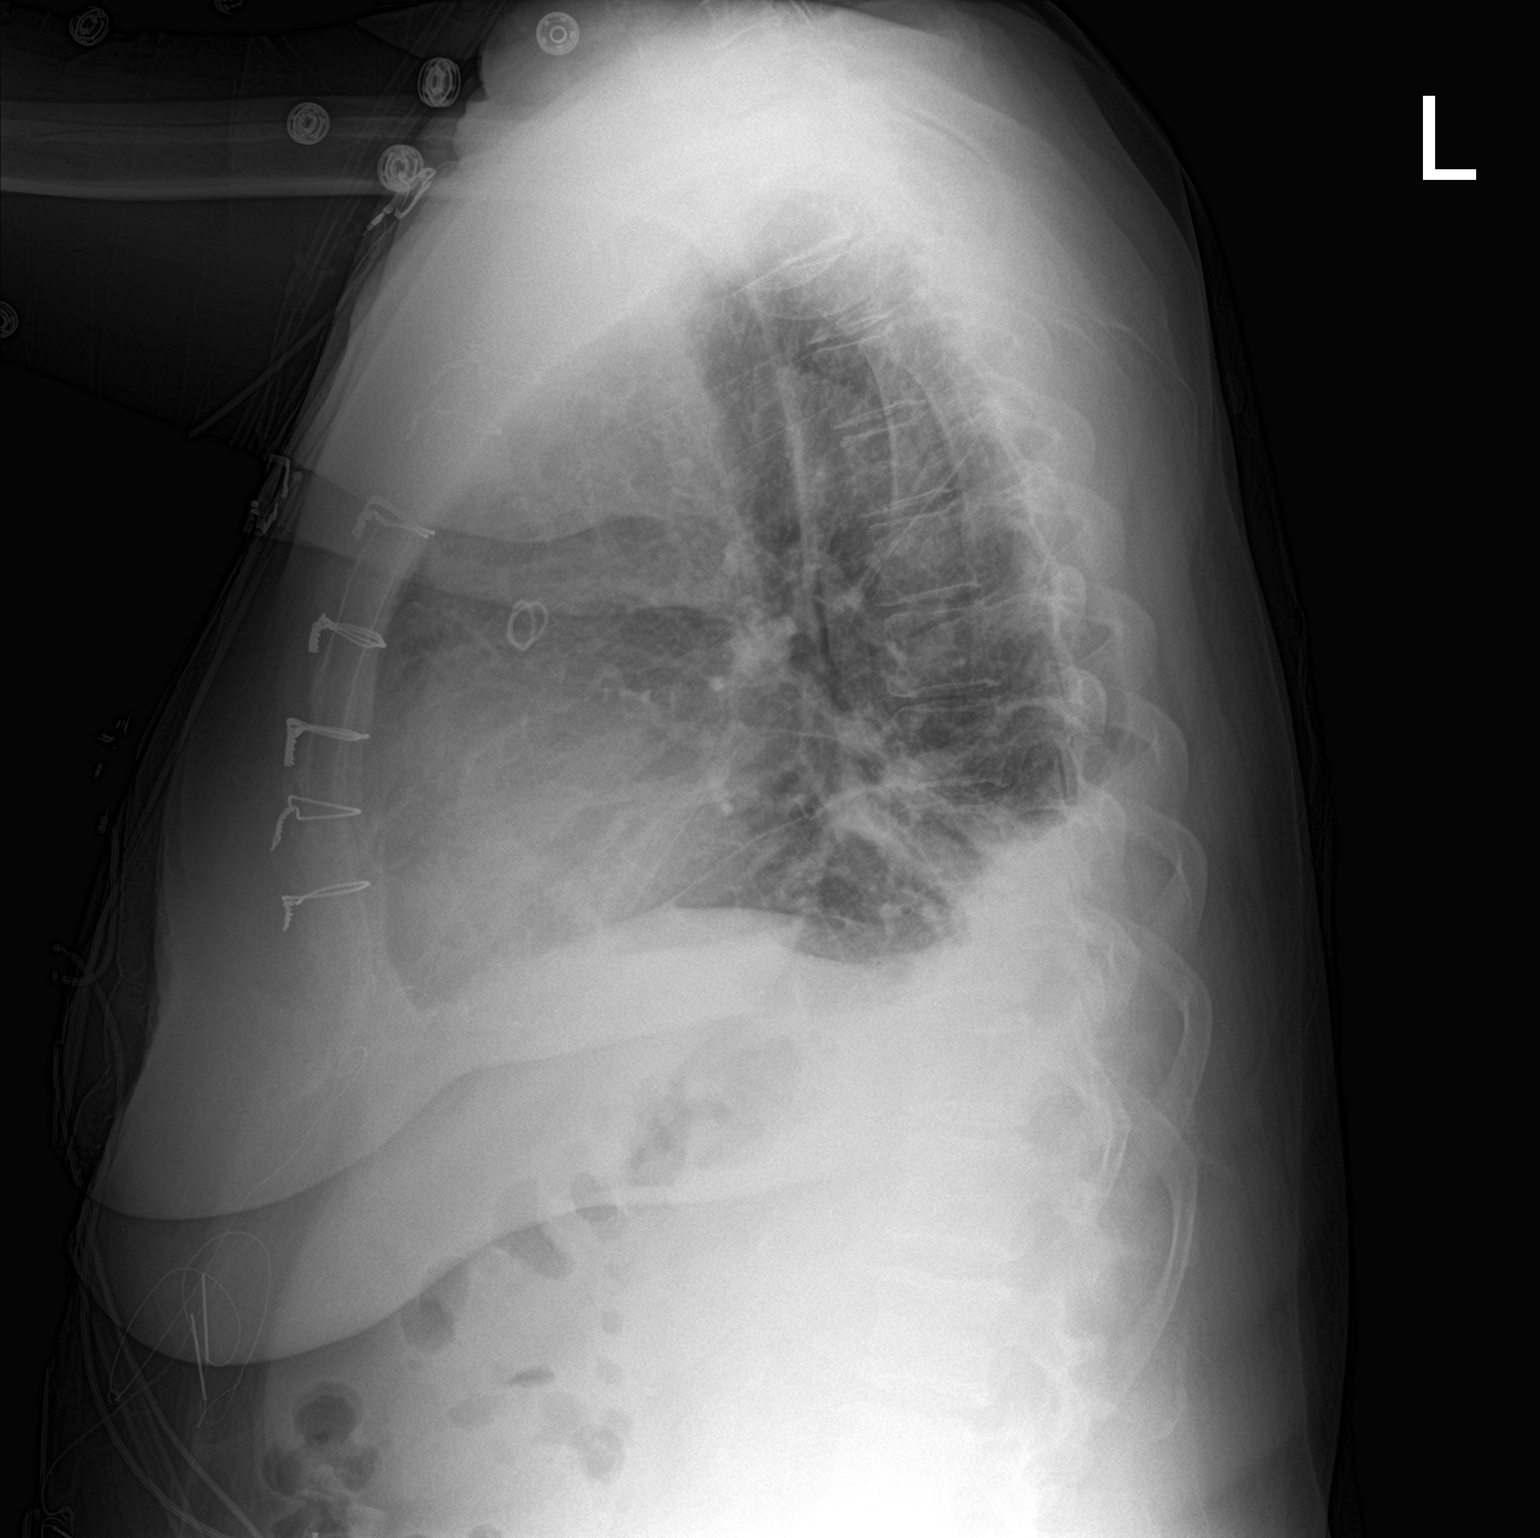

[2 of 2 positions shown; findings below may reference images not displayed]

FINDINGS: PA Lat views, [DATE] a.m., 02/17/2022. Interval removal mediastinal
drain, left chest tube, and right IJ catheter introducer sheath.
There is no visible pneumothorax.

Post CABG changes are stable. Mediastinal configuration unaltered
with mild aortic atherosclerosis.

There is mild cardiomegaly, normal caliber of the central vessels.

Bibasilar opacities appear similar consistent with atelectasis,
consolidation or combination with small bilateral pleural effusions.

The mid and upper lung fields are clear. Overall aeration seems
unchanged.
IMPRESSION: Interval removal of previously indwelling devices as described
above. No change in the bibasilar lung opacities and small layering
pleural effusions. Stable cardiac size.

## 2023-08-05 ENCOUNTER — Other Ambulatory Visit: Payer: Self-pay | Admitting: Cardiology

## 2023-08-09 ENCOUNTER — Other Ambulatory Visit: Payer: Self-pay | Admitting: Cardiology

## 2023-08-09 NOTE — Telephone Encounter (Signed)
Rx refill sent to pharmacy. 

## 2023-08-14 ENCOUNTER — Other Ambulatory Visit: Payer: Self-pay | Admitting: Cardiology

## 2023-08-23 IMAGING — CR DG CHEST 2V
2 series · 2 of 2 positions shown · non-contrast
Comparison: 02/17/2022

CLINICAL DATA: CABG x3.  Shortness of breath.

EXAM:
CHEST - 2 VIEW

[w chest pa]
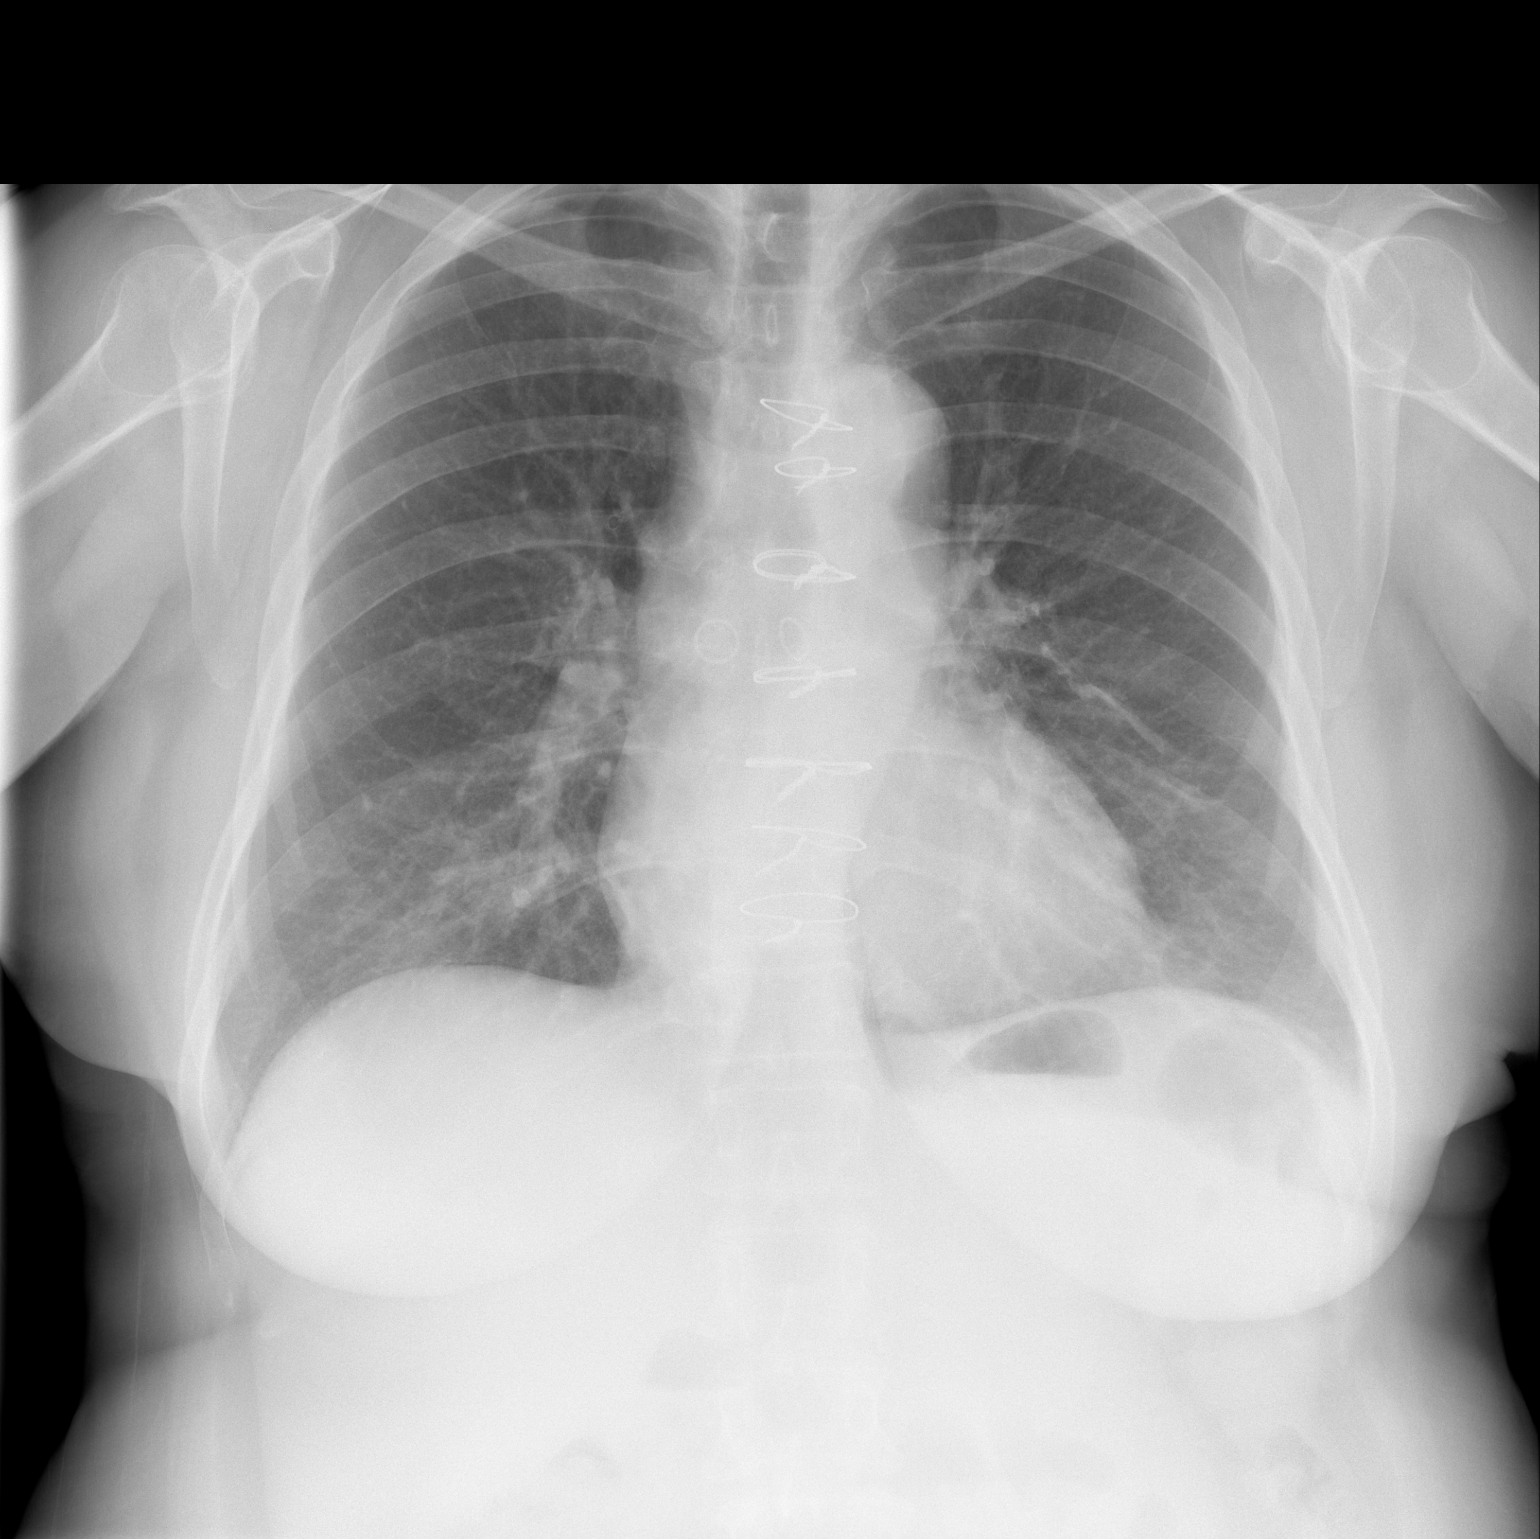

[w chest lat]
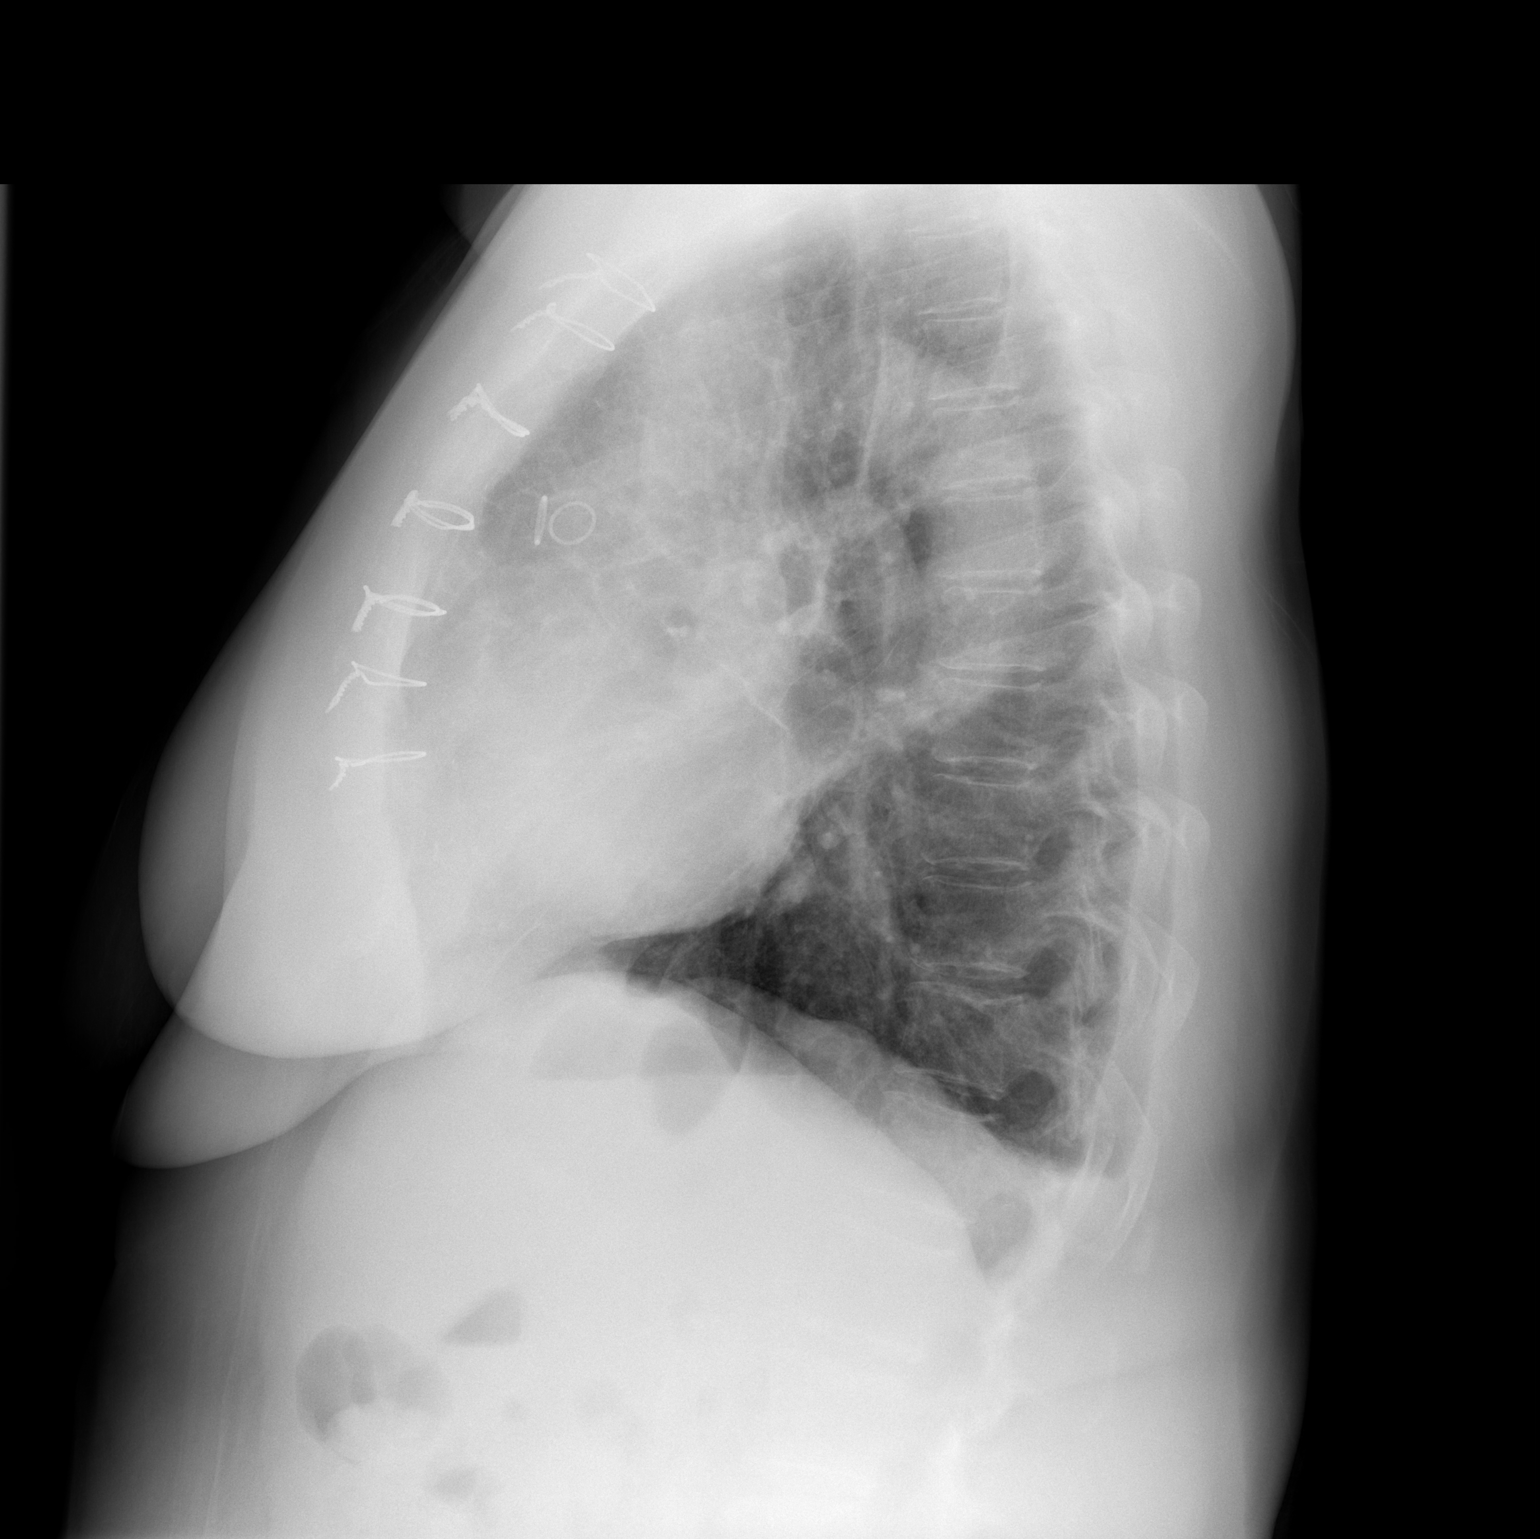

[2 of 2 positions shown; findings below may reference images not displayed]

FINDINGS: Median sternotomy for CABG. Midline trachea. Normal heart size.
Tortuous thoracic aorta. Trace left pleural fluid, decreased. No
right pleural effusion or pneumothorax. Resolved right and
significantly improved left base atelectasis.
IMPRESSION: Improved left base aeration with trace pleural fluid and
subsegmental atelectasis remaining.

No congestive failure or superimposed acute disease.

## 2023-09-07 ENCOUNTER — Other Ambulatory Visit: Payer: Self-pay | Admitting: Cardiology

## 2023-09-08 ENCOUNTER — Other Ambulatory Visit: Payer: Self-pay | Admitting: Cardiology

## 2023-10-10 ENCOUNTER — Other Ambulatory Visit: Payer: Self-pay | Admitting: Cardiology

## 2023-10-23 ENCOUNTER — Other Ambulatory Visit: Payer: Self-pay | Admitting: Cardiology

## 2023-10-25 NOTE — Telephone Encounter (Signed)
Prescription sent to pharmacy.

## 2023-10-29 ENCOUNTER — Other Ambulatory Visit: Payer: Self-pay | Admitting: Cardiology

## 2023-11-05 ENCOUNTER — Other Ambulatory Visit: Payer: Self-pay | Admitting: Cardiology

## 2023-11-18 ENCOUNTER — Other Ambulatory Visit: Payer: Self-pay | Admitting: Cardiology

## 2023-12-04 ENCOUNTER — Encounter: Payer: Self-pay | Admitting: Cardiology

## 2023-12-05 NOTE — Progress Notes (Signed)
 Cardiology Office Note:    Date:  12/06/2023   ID:  [name] [name], DOB [birth year], MRN [id number]  PCP:  Kendra Elyce NOVAK, NP  Cardiologist:  Redell Leiter, MD    Referring MD: Kendra Elyce NOVAK, NP    ASSESSMENT:    1. Coronary artery disease involving native heart without angina pectoris, unspecified vessel or lesion type   2. Essential hypertension   3. Mixed hyperlipidemia   4. Panlobular emphysema (HCC)    PLAN:    In order of problems listed above:  She had successful CABG we did early coronary angiography afterwards no graft occlusion does not have heart failure and her profound functional impairment shortness of breath hypoxemia due to underlying lung disease Continue treatment including low-dose aspirin  and atorvastatin  she has labs checked in her PCP office oral nitrate and ranolazine . Stable hypertension continue treatment low-dose beta-blocker Benicar  hydrochlorothiazide  Oxygen saturation is 88 today I will send a note to her PCP I think she would benefit from further evaluation pulmonary or local allergy immunology Dr. Kozlow and may require ambulatory oxygen.   Next appointment: I will plan to see in 1 year   Medication Adjustments/Labs and Tests Ordered: Current medicines are reviewed at length with the patient today.  Concerns regarding medicines are outlined above.  Orders Placed This Encounter  Procedures   EKG 12-Lead   No orders of the defined types were placed in this encounter.    History of Present Illness:    [name] is a 63 y.o. female with a hx of CAD with CABG hypertension hyperlipidemia and COPD with panlobular emphysema with severe limitation of functional ability last seen 08/22/2022.   Compliance with diet, lifestyle and medications: Yes  When last seen by me I was concerned about the severity of lung disease referred her back to pulmonary.  She tells me she fainted in the waiting room was sent to the  emergency room is never been seen again.  We did repeat coronary angiography her grafts were patent she did not have heart failure. She does take a minimum dose of diuretic. She tells me her oxygen sats are low when she sees her PCP is 78 today and she feels like she has a superimposed respiratory infection wheezing and using her inhaler Short of breath with any activities and even struggles with ADLs.  No chest pain orthopnea PND or syncope Past Medical History:  Diagnosis Date   Accelerating angina [facility])    Adrenal nodule (HCC)    Aortic heart murmur on examination    CAD (coronary artery disease) 02/08/2022   Chest pressure 11/29/2015   Dyspnea on exertion 11/29/2015   Essential hypertension 10/14/2015   Family history of coronary artery disease 11/29/2015   Fatigue 10/14/2015   Frequent UTI 10/14/2015   Generalized anxiety disorder    Hx of CABG 02/14/2022   Insomnia, idiopathic    Left lower quadrant pain 10/14/2015   Mixed hyperlipidemia    Panlobular emphysema (HCC) 11/29/2015   Postop check 05/15/2022   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   albuterol  (VENTOLIN  HFA) 108 (90 Base) MCG/ACT inhaler Inhale 2 puffs into the lungs every  4 (four) hours as needed for wheezing.   aspirin  EC 81 MG tablet Take 1 tablet (81 mg total) by mouth daily. Swallow whole.   atorvastatin  (LIPITOR ) 80 MG tablet Take 1 tablet (80 mg total) by mouth daily. Patient needs appointment for further refills. 1 st attempt   Fluticasone-Umeclidin-Vilant (TRELEGY ELLIPTA ) 200-62.5-25 MCG/ACT AEPB Inhale 1 puff by mouth once daily   furosemide  (LASIX ) 40 MG tablet Take 40 mg (1 tablet) on Monday, Wednesday and Friday.    gabapentin  (NEURONTIN ) 100 MG capsule Take 1 capsule by mouth twice daily   GLUCOSAMINE-CHONDROITIN PO Take 2 tablets by mouth in the morning.   isosorbide  mononitrate (IMDUR ) 30 MG 24 hr tablet Take 1 tablet by mouth once daily   metoprolol  tartrate (LOPRESSOR ) 25 MG tablet Take 0.5 tablets (12.5 mg total) by mouth 2 (two) times daily. Patient needs to come in for follow up appointment. First attempt.   Multiple Vitamin (MULTIVITAMIN WITH MINERALS) TABS tablet Take 1 tablet by mouth in the morning.   olmesartan -hydrochlorothiazide  (BENICAR  HCT) 20-12.5 MG tablet Take 1 tablet by mouth daily. Patient must keep appointment on 12/06/23 for further refills 3 rd/final attempt   Omega-3 Fatty Acids (FISH OIL) 1000 MG CAPS Take 1,000 mg by mouth in the morning.   potassium chloride  (KLOR-CON ) 10 MEQ tablet Take 1 tablet (10 mEq total) by mouth daily.   ranolazine  (RANEXA ) 500 MG 12 hr tablet TAKE 1 TABLET BY MOUTH TWICE DAILY -  MUST  KEEP  12/06/23  APPOINTMENT  FOR  FURTHER  REFILLS   sertraline (ZOLOFT) 100 MG tablet Take 100 mg by mouth daily.   traZODone  (DESYREL ) 100 MG tablet Take 100 mg by mouth at bedtime.      EKGs/Labs/Other Studies Reviewed:    The following studies were reviewed today:  Cardiac Studies & Procedures   CARDIAC CATHETERIZATION  CARDIAC CATHETERIZATION 07/06/2022  Narrative Conclusions: Moderate to severe multivessel coronary artery disease, as detailed below. Small but widely patent LIMA-LAD. Patent SVG-OM3 with ectasia and mild proximal graft disease of up to 30% adjacent to a valve. Patent SVG-rPDA with ectasia and mild-moderate graft disease of up to 50% that does not appear to be flow limiting. Mildly elevated left heart filling pressure (LVEDP 22 mmHg).  Recommendations: Escalate medical therapy; I will add ranolazine  500 mg twice daily to current regimen of metoprolol  and isosorbide  mononitrate. Continue diureses. Aggressive secondary prevention of coronary  artery disease.  Lonni Hanson, MD [facility]  Findings Coronary Findings Diagnostic  Dominance: Right  Left Main Ost LM lesion is 40% stenosed. Dist LM lesion is 25% stenosed.  Left Anterior Descending Mid LAD lesion is 40% stenosed.  Left Circumflex Mid Cx lesion is 70% stenosed. Dist Cx lesion is 100% stenosed.  Right Coronary Artery There is moderate diffuse disease throughout the vessel. Mid RCA lesion is 75% stenosed. Mid RCA to Dist RCA lesion is 100% stenosed.  Saphenous Graft To RPDA SVG graft was visualized by angiography.  The graft is moderately ectatic. Origin lesion is 50% stenosed. Mid Graft to Dist Graft lesion is 30% stenosed.  Saphenous Graft To 3rd Mrg SVG graft was visualized by angiography.  The graft is mildly ectatic. Prox Graft lesion is 30% stenosed.  LIMA LIMA Graft To Mid LAD LIMA graft was visualized by angiography and is small.  The graft exhibits no disease.  Intervention  No interventions have been documented.   CARDIAC CATHETERIZATION  CARDIAC CATHETERIZATION 02/08/2022  Narrative   Mid RCA lesion is  75% stenosed.   Mid LAD lesion is 40% stenosed.   Mid Cx lesion is 70% stenosed.   Dist LM lesion is 50% stenosed.   LV end diastolic pressure is normal.  1.  Multivessel disease consisting of distal left main, left circumflex and right coronary artery.  The arterial tree is diffusely calcified. 2.  LVEDP of 11 mmHg.  Recommendation: Cardiothoracic surgical consult.  Findings Coronary Findings Diagnostic  Dominance: Right  Left Main Dist LM lesion is 50% stenosed.  Left Anterior Descending Mid LAD lesion is 40% stenosed.  Left Circumflex Mid Cx lesion is 70% stenosed.  Right Coronary Artery There is moderate diffuse disease throughout the vessel. Mid RCA lesion is 75% stenosed.  Intervention  No interventions have been documented.    ECHOCARDIOGRAM  ECHOCARDIOGRAM COMPLETE  05/26/2022  Narrative ECHOCARDIOGRAM REPORT    Patient Name:   LUDENE STOKKE [facility] Date of Exam: 05/26/2022 Medical Rec #:  [id number]                    Height:       64.0 in Accession #:    [id number]                   Weight:       194.0 lb Date of Birth:  May 06, 1961                     BSA:          1.931 m Patient Age:    63 years                     BP:           136/80 mmHg Patient Gender: F                            HR:           72 bpm. Exam Location:  [facility]  Procedure: 2D Echo, Cardiac Doppler, Color Doppler and Strain Analysis  Indications:    SOB (shortness of breath) [R06.02 (ICD-10-CM)]; Panlobular emphysema (HCC) [J43.1 (ICD-10-CM)]; Coronary artery disease involving native heart without angina pectoris, unspecified vessel or lesion type [I25.10 (ICD-10-CM)]; Essential hypertension [I10 (ICD-10-CM)]; Mixed hyperlipidemia [E78.2 (ICD-10-CM)]  History:        Patient has prior history of Echocardiogram examinations, most recent 02/09/2022. CAD, Prior Cardiac Surgery, Signs/Symptoms:Dyspnea; Risk Factors:Hypertension and Dyslipidemia.  Sonographer:    Charlie Jointer RDCS Referring Phys: [id number] Francely Craw J Citlalli Weikel  IMPRESSIONS   1. Left ventricular ejection fraction, by estimation, is 60 to 65%. The left ventricle has normal function. The left ventricle has no regional wall motion abnormalities. Left ventricular diastolic parameters were normal. 2. Right ventricular systolic function is normal. The right ventricular size is normal. 3. The mitral valve is normal in structure. No evidence of mitral valve regurgitation. No evidence of mitral stenosis. 4. The aortic valve is normal in structure. Aortic valve regurgitation is not visualized. No aortic stenosis is present. 5. The inferior vena cava is normal in size with greater than 50% respiratory variability, suggesting right atrial pressure of 3 mmHg.  FINDINGS Left Ventricle: Left ventricular ejection  fraction, by estimation, is 60 to 65%. The left ventricle has normal function. The left ventricle has no regional wall motion abnormalities. The left ventricular internal cavity size was normal in size. There is no left ventricular hypertrophy. Left ventricular diastolic parameters  were normal.  Right Ventricle: The right ventricular size is normal. No increase in right ventricular wall thickness. Right ventricular systolic function is normal.  Left Atrium: Left atrial size was normal in size.  Right Atrium: Right atrial size was normal in size.  Pericardium: There is no evidence of pericardial effusion.  Mitral Valve: The mitral valve is normal in structure. No evidence of mitral valve regurgitation. No evidence of mitral valve stenosis.  Tricuspid Valve: The tricuspid valve is normal in structure. Tricuspid valve regurgitation is not demonstrated. No evidence of tricuspid stenosis.  Aortic Valve: The aortic valve is normal in structure. Aortic valve regurgitation is not visualized. No aortic stenosis is present.  Pulmonic Valve: The pulmonic valve was normal in structure. Pulmonic valve regurgitation is not visualized. No evidence of pulmonic stenosis.  Aorta: The aortic root is normal in size and structure.  Venous: The inferior vena cava is normal in size with greater than 50% respiratory variability, suggesting right atrial pressure of 3 mmHg.  IAS/Shunts: No atrial level shunt detected by color flow Doppler.   LEFT VENTRICLE PLAX 2D LVIDd:         4.10 cm   Diastology LVIDs:         2.80 cm   LV e' medial:    7.07 cm/s LV PW:         1.00 cm   LV E/e' medial:  8.9 LV IVS:        1.00 cm   LV e' lateral:   11.10 cm/s LVOT diam:     2.00 cm   LV E/e' lateral: 5.7 LV SV:         80 LV SV Index:   41 LVOT Area:     3.14 cm   RIGHT VENTRICLE            IVC RV Basal diam:  2.40 cm    IVC diam: 1.80 cm RV S prime:     6.74 cm/s TAPSE (M-mode): 1.2 cm  LEFT ATRIUM              Index        RIGHT ATRIUM           Index LA diam:        3.40 cm 1.76 cm/m   RA Area:     14.80 cm LA Vol (A2C):   43.4 ml 22.47 ml/m  RA Volume:   33.50 ml  17.35 ml/m LA Vol (A4C):   39.6 ml 20.51 ml/m LA Biplane Vol: 41.4 ml 21.44 ml/m AORTIC VALVE LVOT Vmax:   134.00 cm/s LVOT Vmean:  90.000 cm/s LVOT VTI:    0.255 m  AORTA Ao Root diam: 3.50 cm Ao Asc diam:  3.10 cm Ao Desc diam: 2.40 cm  MITRAL VALVE MV Area (PHT): 4.31 cm    SHUNTS MV Decel Time: 176 msec    Systemic VTI:  0.26 m MV E velocity: 63.00 cm/s  Systemic Diam: 2.00 cm MV A velocity: 68.60 cm/s MV E/A ratio:  0.92  Lamar Fitch MD Electronically signed by Lamar Fitch MD Signature Date/Time: 05/26/2022/5:32:55 PM    Final  TEE  ECHO INTRAOPERATIVE TEE 02/14/2022  Narrative *INTRAOPERATIVE TRANSESOPHAGEAL REPORT *    Patient Name:   CHANDANI ROGOWSKI [facility] Date of Exam: 02/14/2022 Medical Rec #:  [id number]                    Height:       64.0  in Accession #:    [id number]                   Weight:       188.0 lb Date of Birth:  Sep 07, 1961                     BSA:          1.91 m Patient Age:    63 years                     BP:           1456/86 mmHg Patient Gender: F                            HR:           67 bpm. Exam Location:  Anesthesiology  Transesophogeal exam was perform intraoperatively during surgical procedure. Patient was closely monitored under general anesthesia during the entirety of examination.  Indications:     Coronary artery disease involving native heart with angina pectoris, unspecified vessel or lesion type (HCC) [I25.119 (ICD-10-CM)] Performing Phys: [id number] PETER VANTRIGT Diagnosing Phys: Juliene Clinton MD  Complications: No known complications during this procedure. POST-OP IMPRESSIONS Overall, there were no significant changes from pre-bypass.  PRE-OP FINDINGS Left Ventricle: The left ventricle has hyperdynamic systolic function, with an ejection  fraction of >65%. The cavity size was normal. There is no increase in left ventricular wall thickness.   Right Ventricle: The right ventricle has normal systolic function. The cavity was normal. There is no increase in right ventricular wall thickness.  Left Atrium: Left atrial size was normal in size. No left atrial/left atrial appendage thrombus was detected.  Right Atrium: Right atrial size was normal in size.  Interatrial Septum: No atrial level shunt detected by color flow Doppler.  Pericardium: There is no evidence of pericardial effusion.  Mitral Valve: The mitral valve is normal in structure. Mitral valve regurgitation is trivial by color flow Doppler. There is No evidence of mitral stenosis.  Tricuspid Valve: The tricuspid valve was normal in structure. Tricuspid valve regurgitation was not visualized by color flow Doppler.  Aortic Valve: The aortic valve is normal in structure. Aortic valve regurgitation is trivial by color flow Doppler. The jet is centrally-directed. There is no stenosis of the aortic valve.   Pulmonic Valve: The pulmonic valve was normal in structure. Pulmonic valve regurgitation is trivial by color flow Doppler.   Aorta: The aortic root, ascending aorta and aortic arch are normal in size and structure.   Juliene Clinton MD Electronically signed by Juliene Clinton MD Signature Date/Time: 02/16/2022/6:53:16 PM    Final            EKG Interpretation Date/Time:  Thursday December 06 2023 14:46:14 EST Ventricular Rate:  71 PR Interval:  158 QRS Duration:  80 QT Interval:  408 QTC Calculation: 443 R Axis:   -3  Text Interpretation: Normal sinus rhythm Low voltage QRS ABNORMAL R WAVE PROGRESSION When compared with ECG of 11-Sep-2022 13:27, No significant change was found Confirmed by Monetta Rogue ([id number]) on 12/06/2023 2:57:36 PM   Recent Labs: No results found for requested labs within last 365 days.  Recent Lipid Panel    Component Value Date/Time    CHOL 187 08/22/2022 0939   TRIG 139 08/22/2022 0939   HDL 52 08/22/2022 0939   CHOLHDL 3.6 08/22/2022 0939   LDLCALC  110 (H) 08/22/2022 0939    Physical Exam:    VS:  BP 120/72   Pulse 71   Ht 5' 4 (1.626 m)   Wt 222 lb 3.2 oz (100.8 kg)   SpO2 (!) 88%   BMI 38.14 kg/m     Wt Readings from Last 3 Encounters:  12/06/23 222 lb 3.2 oz (100.8 kg)  09/11/22 205 lb 6.4 oz (93.2 kg)  08/22/22 205 lb 3.2 oz (93.1 kg)     GEN: Obese BMI exceeds 35 well nourished, well developed in no acute distress HEENT: Normal NECK: No JVD; No carotid bruits LYMPHATICS: No lymphadenopathy CARDIAC: RRR, no murmurs, rubs, gallops distant heart sounds RESPIRATORY: Very hyperinflated diminished breath sounds prolonged expiration no wheezing ABDOMEN: Soft, non-tender, non-distended MUSCULOSKELETAL:  No edema; No deformity  SKIN: Warm and dry NEUROLOGIC:  Alert and oriented x 3 PSYCHIATRIC:  Normal affect    Signed, Redell Leiter, MD  12/06/2023 3:01 PM    [facility]

## 2023-12-06 ENCOUNTER — Encounter: Payer: Self-pay | Admitting: Cardiology

## 2023-12-06 ENCOUNTER — Ambulatory Visit: Payer: BLUE CROSS/BLUE SHIELD | Attending: Cardiology | Admitting: Cardiology

## 2023-12-06 VITALS — BP 120/72 | HR 71 | Ht 64.0 in | Wt 222.2 lb

## 2023-12-06 DIAGNOSIS — I10 Essential (primary) hypertension: Secondary | ICD-10-CM | POA: Diagnosis not present

## 2023-12-06 DIAGNOSIS — J431 Panlobular emphysema: Secondary | ICD-10-CM

## 2023-12-06 DIAGNOSIS — E782 Mixed hyperlipidemia: Secondary | ICD-10-CM | POA: Diagnosis not present

## 2023-12-06 DIAGNOSIS — I2510 Atherosclerotic heart disease of native coronary artery without angina pectoris: Secondary | ICD-10-CM | POA: Diagnosis not present

## 2023-12-06 NOTE — Patient Instructions (Signed)

## 2023-12-07 ENCOUNTER — Other Ambulatory Visit: Payer: Self-pay | Admitting: Cardiology

## 2023-12-07 NOTE — Telephone Encounter (Signed)
 Prescription sent to pharmacy.

## 2023-12-18 ENCOUNTER — Other Ambulatory Visit: Payer: Self-pay | Admitting: Cardiology

## 2023-12-18 NOTE — Telephone Encounter (Signed)
 Prescription sent to pharmacy.

## 2024-01-02 ENCOUNTER — Other Ambulatory Visit: Payer: Self-pay | Admitting: Cardiology

## 2024-01-02 NOTE — Telephone Encounter (Signed)
 Prescription sent to pharmacy.

## 2024-01-29 ENCOUNTER — Other Ambulatory Visit: Payer: Self-pay | Admitting: Cardiology

## 2024-01-29 NOTE — Telephone Encounter (Signed)
 Prescription sent to pharmacy.

## 2024-06-23 ENCOUNTER — Other Ambulatory Visit: Payer: Self-pay | Admitting: Cardiology

## 2024-10-17 ENCOUNTER — Other Ambulatory Visit: Payer: Self-pay | Admitting: Cardiology

## 2024-10-29 ENCOUNTER — Other Ambulatory Visit: Payer: Self-pay | Admitting: Cardiology

## 2024-11-19 ENCOUNTER — Other Ambulatory Visit: Payer: Self-pay | Admitting: Cardiology

## 2024-11-27 ENCOUNTER — Other Ambulatory Visit: Payer: Self-pay | Admitting: Cardiology
